# Patient Record
Sex: Female | Born: 1935 | Race: White | Hispanic: No | Smoking: Current every day smoker
Health system: Southern US, Community
[De-identification: ages and names within clinical notes are randomized; demographics above are authoritative.]

## PROBLEM LIST (undated history)

## (undated) DIAGNOSIS — R0600 Dyspnea, unspecified: Secondary | ICD-10-CM

## (undated) DIAGNOSIS — I2510 Atherosclerotic heart disease of native coronary artery without angina pectoris: Secondary | ICD-10-CM

## (undated) DIAGNOSIS — K219 Gastro-esophageal reflux disease without esophagitis: Secondary | ICD-10-CM

## (undated) DIAGNOSIS — G4730 Sleep apnea, unspecified: Secondary | ICD-10-CM

## (undated) DIAGNOSIS — M25559 Pain in unspecified hip: Secondary | ICD-10-CM

## (undated) DIAGNOSIS — T458X5A Adverse effect of other primarily systemic and hematological agents, initial encounter: Secondary | ICD-10-CM

## (undated) DIAGNOSIS — Z720 Tobacco use: Secondary | ICD-10-CM

## (undated) DIAGNOSIS — M87180 Osteonecrosis due to drugs, jaw: Secondary | ICD-10-CM

## (undated) DIAGNOSIS — M1990 Unspecified osteoarthritis, unspecified site: Secondary | ICD-10-CM

## (undated) DIAGNOSIS — I471 Supraventricular tachycardia: Secondary | ICD-10-CM

## (undated) DIAGNOSIS — N19 Unspecified kidney failure: Secondary | ICD-10-CM

## (undated) DIAGNOSIS — I10 Essential (primary) hypertension: Secondary | ICD-10-CM

## (undated) DIAGNOSIS — K649 Unspecified hemorrhoids: Secondary | ICD-10-CM

## (undated) DIAGNOSIS — E785 Hyperlipidemia, unspecified: Secondary | ICD-10-CM

## (undated) DIAGNOSIS — M109 Gout, unspecified: Secondary | ICD-10-CM

## (undated) DIAGNOSIS — N189 Chronic kidney disease, unspecified: Secondary | ICD-10-CM

## (undated) DIAGNOSIS — J449 Chronic obstructive pulmonary disease, unspecified: Secondary | ICD-10-CM

## (undated) DIAGNOSIS — J45909 Unspecified asthma, uncomplicated: Secondary | ICD-10-CM

## (undated) HISTORY — DX: Gastro-esophageal reflux disease without esophagitis: K21.9

## (undated) HISTORY — DX: Sleep apnea, unspecified: G47.30

## (undated) HISTORY — DX: Chronic kidney disease, unspecified: N18.9

## (undated) HISTORY — DX: Dyspnea, unspecified: R06.00

## (undated) HISTORY — DX: Pain in unspecified hip: M25.559

## (undated) HISTORY — DX: Adverse effect of other primarily systemic and hematological agents, initial encounter: M87.180

## (undated) HISTORY — DX: Essential (primary) hypertension: I10

## (undated) HISTORY — DX: Tobacco use: Z72.0

## (undated) HISTORY — DX: Atherosclerotic heart disease of native coronary artery without angina pectoris: I25.10

## (undated) HISTORY — DX: Supraventricular tachycardia: I47.1

## (undated) HISTORY — DX: Unspecified osteoarthritis, unspecified site: M19.90

## (undated) HISTORY — DX: Chronic obstructive pulmonary disease, unspecified: J44.9

## (undated) HISTORY — DX: Hyperlipidemia, unspecified: E78.5

## (undated) HISTORY — DX: Unspecified asthma, uncomplicated: J45.909

## (undated) HISTORY — DX: Gout, unspecified: M10.9

## (undated) HISTORY — PX: KNEE ARTHROSCOPY: SUR90

## (undated) HISTORY — PX: BREAST BIOPSY: SHX20

## (undated) HISTORY — DX: Unspecified hemorrhoids: K64.9

## (undated) HISTORY — DX: Unspecified kidney failure: N19

## (undated) HISTORY — PX: REPLACEMENT TOTAL KNEE: SUR1224

## (undated) HISTORY — PX: REPLACEMENT UNICONDYLAR JOINT KNEE: SUR1227

## (undated) HISTORY — DX: Adverse effect of other primarily systemic and hematological agents, initial encounter: T45.8X5A

---

## 1997-07-27 ENCOUNTER — Other Ambulatory Visit: Admission: RE | Admit: 1997-07-27 | Discharge: 1997-07-27 | Payer: Self-pay | Admitting: *Deleted

## 1997-07-27 ENCOUNTER — Ambulatory Visit (HOSPITAL_COMMUNITY): Admission: RE | Admit: 1997-07-27 | Discharge: 1997-07-27 | Payer: Self-pay | Admitting: *Deleted

## 1997-08-05 ENCOUNTER — Ambulatory Visit (HOSPITAL_COMMUNITY): Admission: RE | Admit: 1997-08-05 | Discharge: 1997-08-05 | Payer: Self-pay | Admitting: Specialist

## 1997-08-12 ENCOUNTER — Ambulatory Visit (HOSPITAL_COMMUNITY): Admission: RE | Admit: 1997-08-12 | Discharge: 1997-08-12 | Payer: Self-pay | Admitting: Specialist

## 1997-09-09 ENCOUNTER — Ambulatory Visit (HOSPITAL_COMMUNITY): Admission: RE | Admit: 1997-09-09 | Discharge: 1997-09-09 | Payer: Self-pay | Admitting: Specialist

## 1998-08-16 ENCOUNTER — Other Ambulatory Visit: Admission: RE | Admit: 1998-08-16 | Discharge: 1998-08-16 | Payer: Self-pay | Admitting: *Deleted

## 1998-08-18 ENCOUNTER — Ambulatory Visit (HOSPITAL_COMMUNITY): Admission: RE | Admit: 1998-08-18 | Discharge: 1998-08-18 | Payer: Self-pay | Admitting: *Deleted

## 1999-08-23 ENCOUNTER — Other Ambulatory Visit: Admission: RE | Admit: 1999-08-23 | Discharge: 1999-08-23 | Payer: Self-pay | Admitting: *Deleted

## 1999-08-23 ENCOUNTER — Ambulatory Visit (HOSPITAL_COMMUNITY): Admission: RE | Admit: 1999-08-23 | Discharge: 1999-08-23 | Payer: Self-pay | Admitting: *Deleted

## 2000-04-02 ENCOUNTER — Inpatient Hospital Stay (HOSPITAL_COMMUNITY): Admission: AD | Admit: 2000-04-02 | Discharge: 2000-04-05 | Payer: Self-pay | Admitting: Orthopedic Surgery

## 2000-04-02 ENCOUNTER — Encounter: Payer: Self-pay | Admitting: Orthopedic Surgery

## 2000-04-05 ENCOUNTER — Inpatient Hospital Stay (HOSPITAL_COMMUNITY)
Admission: RE | Admit: 2000-04-05 | Discharge: 2000-04-11 | Payer: Self-pay | Admitting: Physical Medicine & Rehabilitation

## 2000-08-29 ENCOUNTER — Ambulatory Visit (HOSPITAL_COMMUNITY): Admission: RE | Admit: 2000-08-29 | Discharge: 2000-08-29 | Payer: Self-pay | Admitting: Internal Medicine

## 2000-08-29 ENCOUNTER — Encounter: Payer: Self-pay | Admitting: Internal Medicine

## 2000-11-13 ENCOUNTER — Ambulatory Visit (HOSPITAL_COMMUNITY): Admission: RE | Admit: 2000-11-13 | Discharge: 2000-11-13 | Payer: Self-pay | Admitting: Gastroenterology

## 2001-08-28 ENCOUNTER — Other Ambulatory Visit: Admission: RE | Admit: 2001-08-28 | Discharge: 2001-08-28 | Payer: Self-pay | Admitting: Internal Medicine

## 2001-08-28 ENCOUNTER — Ambulatory Visit (HOSPITAL_COMMUNITY): Admission: RE | Admit: 2001-08-28 | Discharge: 2001-08-28 | Payer: Self-pay | Admitting: Internal Medicine

## 2001-08-28 ENCOUNTER — Encounter: Payer: Self-pay | Admitting: Internal Medicine

## 2001-12-17 ENCOUNTER — Ambulatory Visit (HOSPITAL_COMMUNITY): Admission: RE | Admit: 2001-12-17 | Discharge: 2001-12-17 | Payer: Self-pay | Admitting: Internal Medicine

## 2001-12-17 ENCOUNTER — Encounter: Payer: Self-pay | Admitting: Internal Medicine

## 2002-09-01 ENCOUNTER — Encounter: Payer: Self-pay | Admitting: Internal Medicine

## 2002-09-01 ENCOUNTER — Ambulatory Visit (HOSPITAL_COMMUNITY): Admission: RE | Admit: 2002-09-01 | Discharge: 2002-09-01 | Payer: Self-pay | Admitting: Internal Medicine

## 2003-09-01 ENCOUNTER — Other Ambulatory Visit: Admission: RE | Admit: 2003-09-01 | Discharge: 2003-09-01 | Payer: Self-pay | Admitting: Internal Medicine

## 2003-09-23 ENCOUNTER — Encounter (INDEPENDENT_AMBULATORY_CARE_PROVIDER_SITE_OTHER): Payer: Self-pay | Admitting: Specialist

## 2003-09-23 ENCOUNTER — Ambulatory Visit (HOSPITAL_COMMUNITY): Admission: RE | Admit: 2003-09-23 | Discharge: 2003-09-23 | Payer: Self-pay | Admitting: Obstetrics and Gynecology

## 2004-09-08 ENCOUNTER — Ambulatory Visit (HOSPITAL_COMMUNITY): Admission: RE | Admit: 2004-09-08 | Discharge: 2004-09-08 | Payer: Self-pay | Admitting: Internal Medicine

## 2005-09-12 ENCOUNTER — Ambulatory Visit (HOSPITAL_COMMUNITY): Admission: RE | Admit: 2005-09-12 | Discharge: 2005-09-12 | Payer: Self-pay | Admitting: Internal Medicine

## 2005-09-12 ENCOUNTER — Other Ambulatory Visit: Admission: RE | Admit: 2005-09-12 | Discharge: 2005-09-12 | Payer: Self-pay | Admitting: Internal Medicine

## 2005-09-21 ENCOUNTER — Ambulatory Visit (HOSPITAL_COMMUNITY): Admission: RE | Admit: 2005-09-21 | Discharge: 2005-09-21 | Payer: Self-pay | Admitting: Internal Medicine

## 2007-05-23 IMAGING — CR DG CHEST 2V
2 series · 2 of 2 positions shown · non-contrast
Comparison: 09/08/04.

CLINICAL DATA: Annual physical.  Smoker.  
 CHEST ? 2 VIEW:

[view not recorded (1 of 2)]
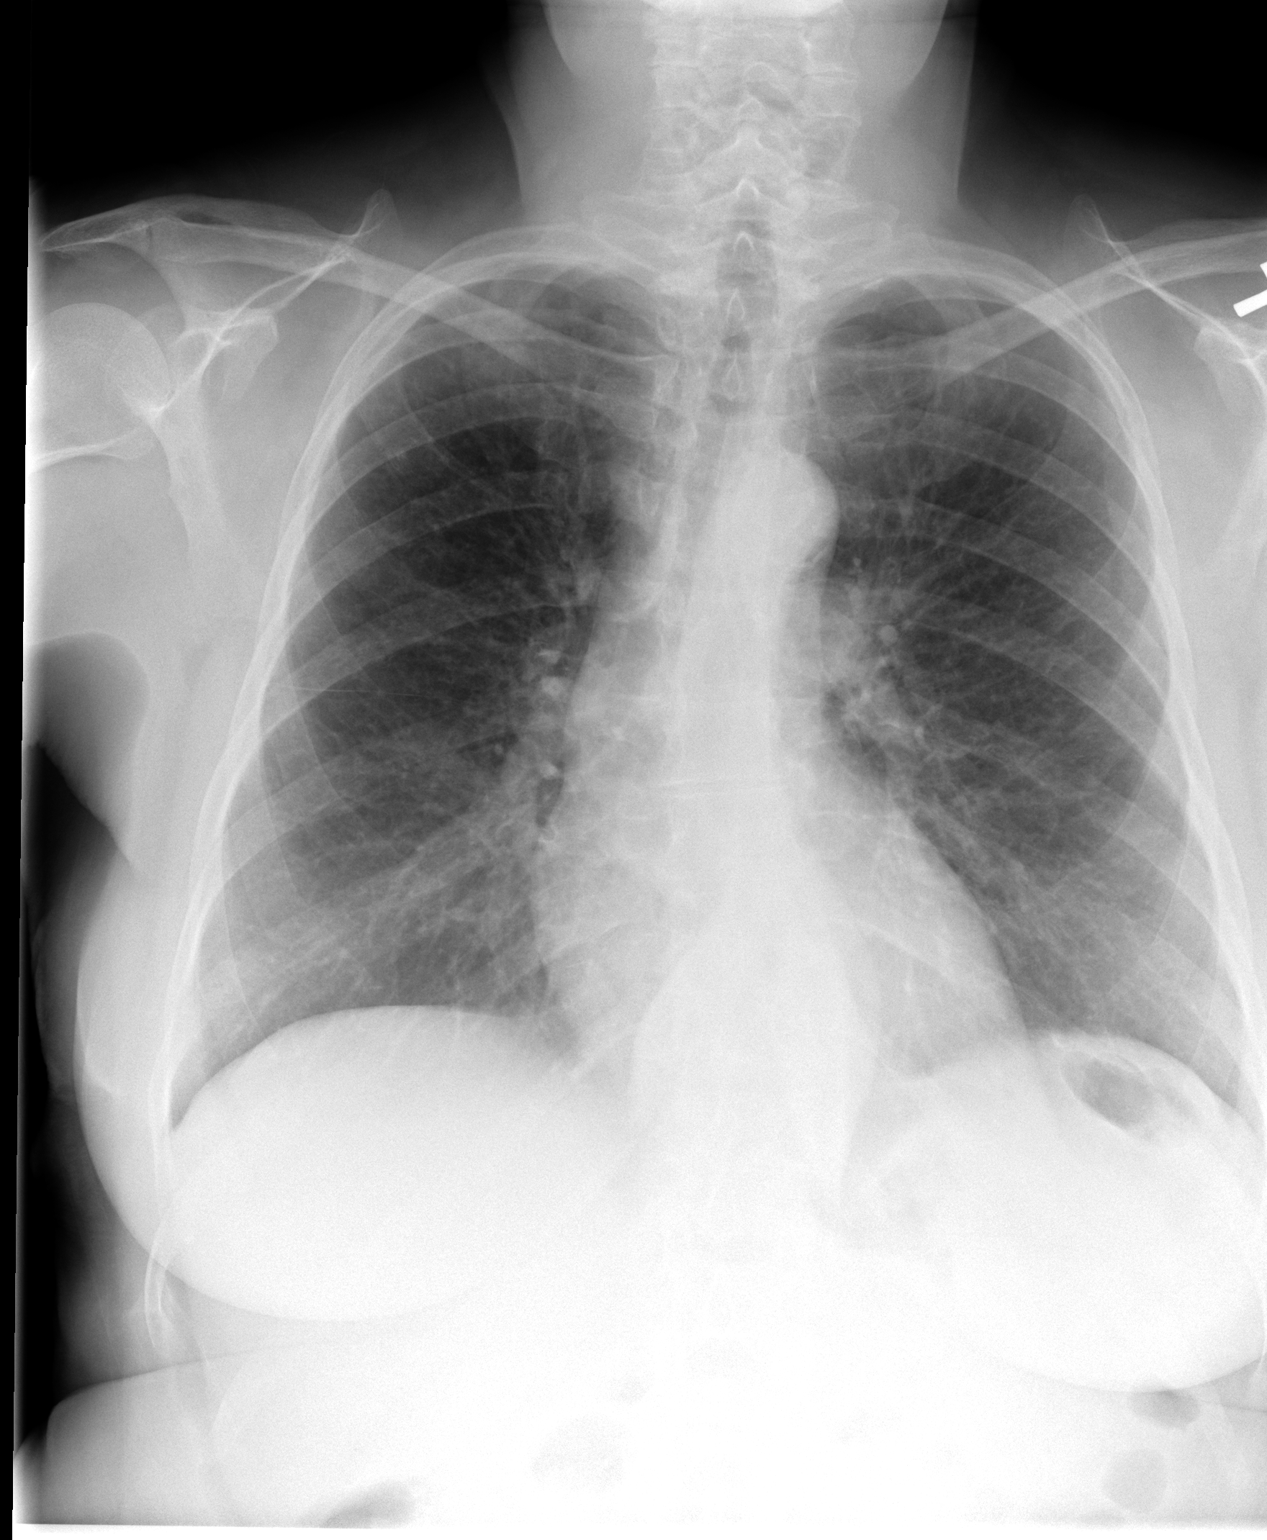

[view not recorded (2 of 2)]
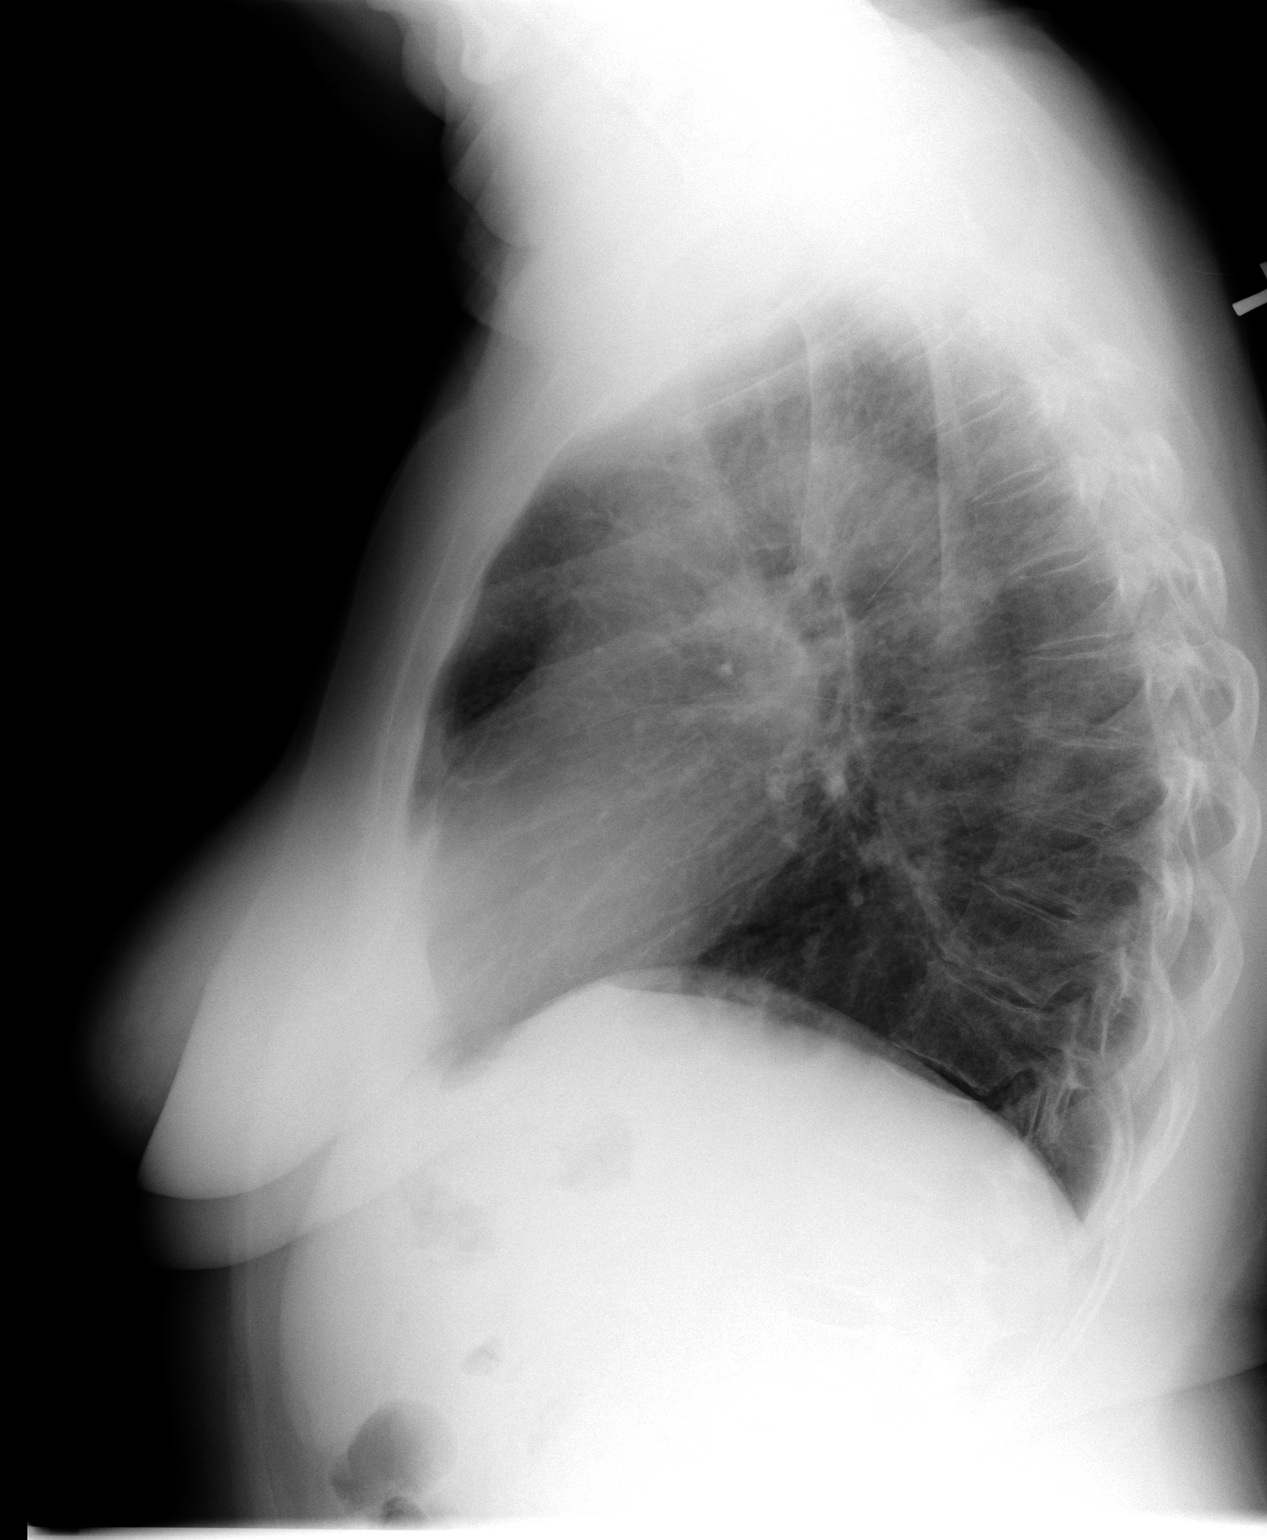

[2 of 2 positions shown; findings below may reference images not displayed]

FINDINGS: Heart size is normal.  There is an abnormal contour to the azygoesophageal recess on the right.  This is new since prior studies.  Interstitial markings remain mildly prominent without focal airspace opacity.  No effusion or pneumothorax.  S shaped scoliosis of the thoracolumbar spine is again noted.
IMPRESSION: 1. No acute finding. 
 2. Abnormal contour to the azygoesophageal recess on the right.  Mass versus lymphadenopathy could account for this versus superimposition of normal structures.  I would recommend contrast enhanced CT for further evaluation.

 This was discussed with [place], Dr. Iebe?[place] nurse by Dr. Kokkie on 09/12/05 at [DATE].

## 2007-06-01 IMAGING — CT CT CHEST W/ CM
4 series · 18 of 29 positions shown, 19 images · IV contrast ([ID] OMNIP 300%)
Comparison: [REDACTED] chest x-ray 09/12/05.

CLINICAL DATA: Right inferior mediastinal abnormality on [REDACTED] chest x-ray of 09/12/05 for further evaluation of possible mass or adenopathy.  
CHEST CT WITH CONTRAST:
TECHNIQUE: Multidetector CT imaging of the chest was performed following the standard protocol during bolus administration of intravenous contrast.
Contrast:  100 cc Omnipaque 300.

[Series 2: chest w/ · axial · 0.66mm/px · z∈[-240,-80]mm · 3 of 65 slices shown, 4 images]
[im 17/65  mediastinal]
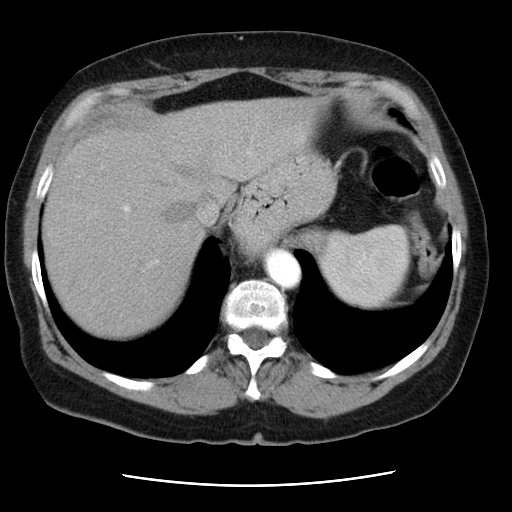
[im 17/65  lung]
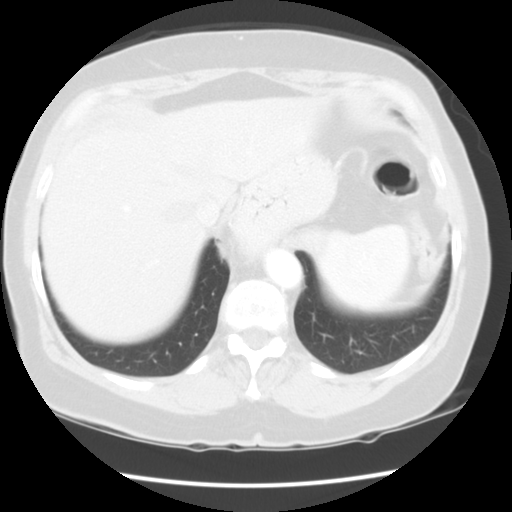
[im 33/65  lung]
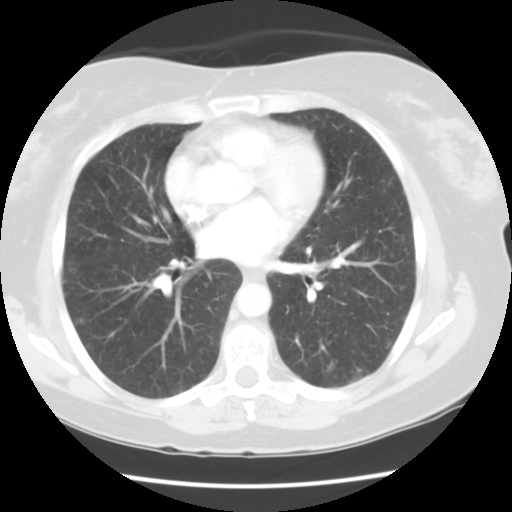
[im 49/65  lung]
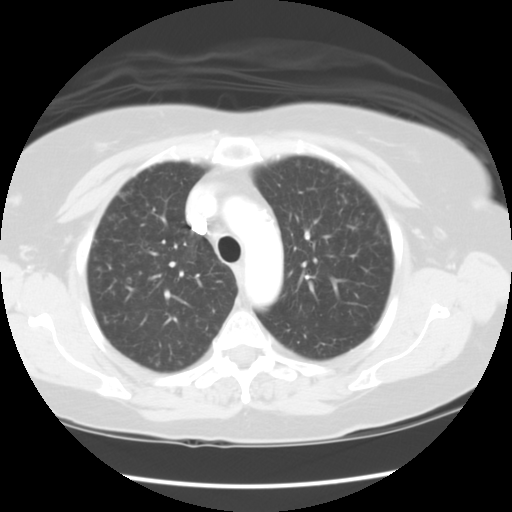

[Series 3: recon 2: chest w/ · axial · 0.66mm/px · z∈[-200,-100]mm · 3 of 60 slices shown]
[im 20/60  lung]
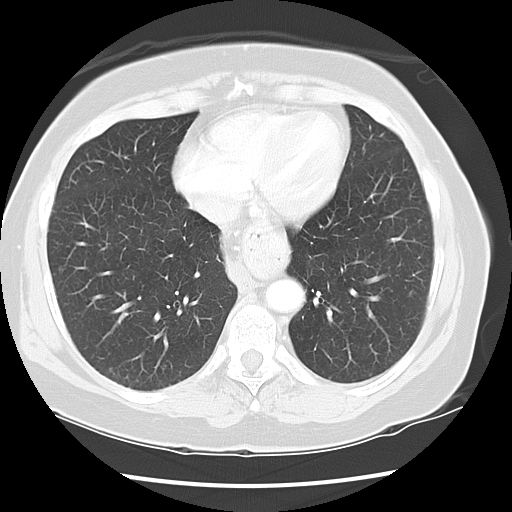
[im 28/60  lung]
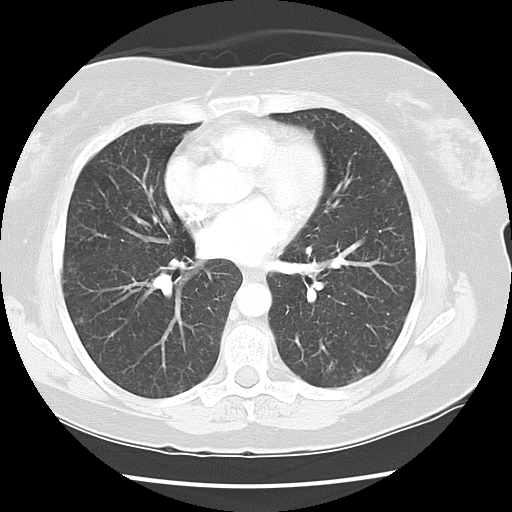
[im 40/60  lung]
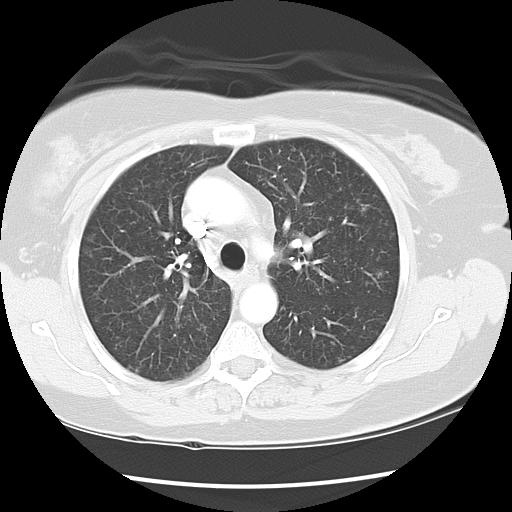

[Series 400: reformatted · sagittal · 0.66mm/px · 8 of 142 slices shown (1 of 2)]
[im 16/142  lung]
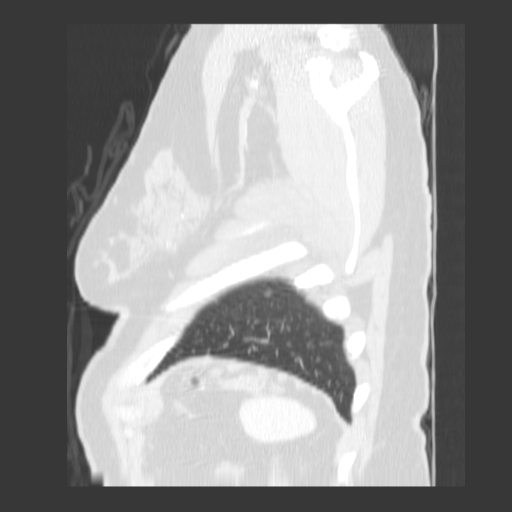
[im 32/142  lung]
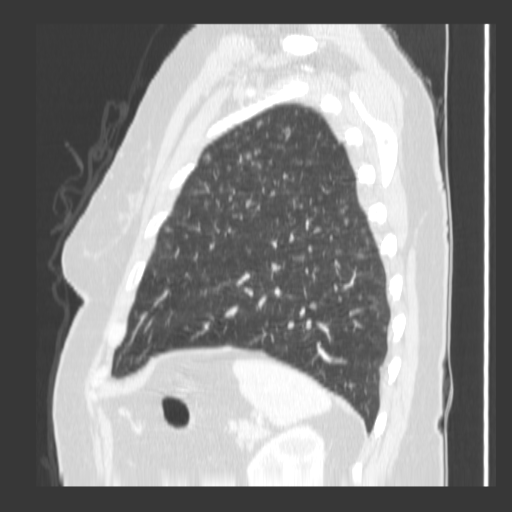
[im 48/142  lung]
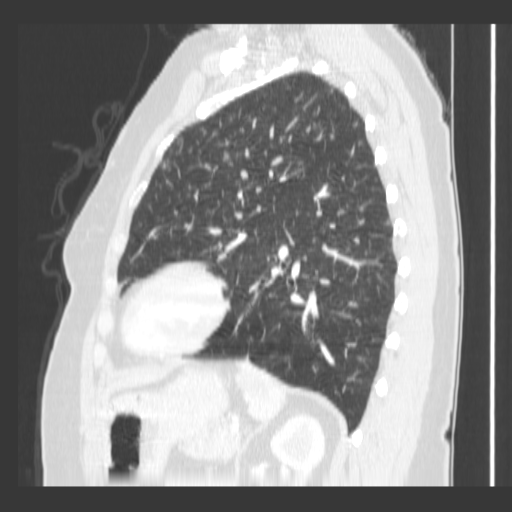
[im 63/142  lung]
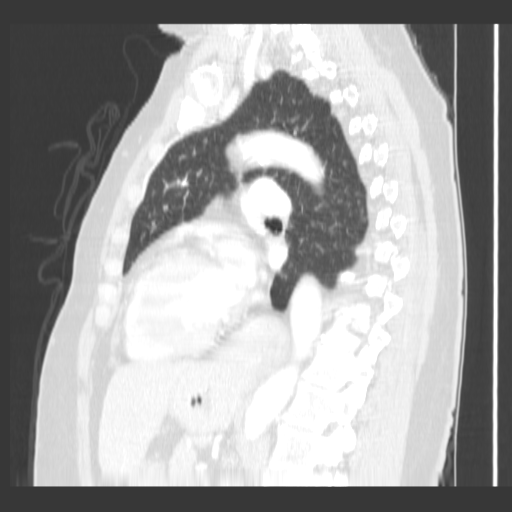
[im 79/142  lung]
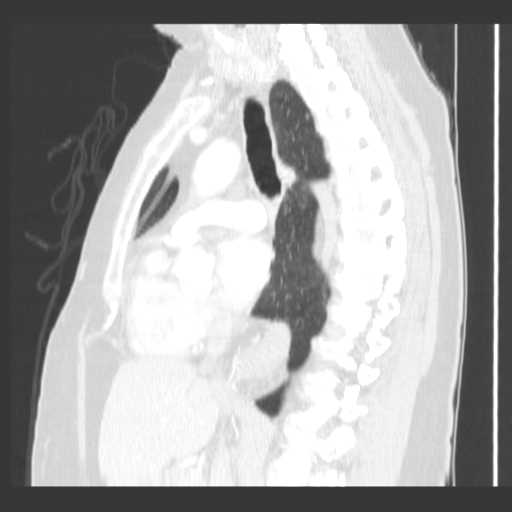
[im 95/142  lung]
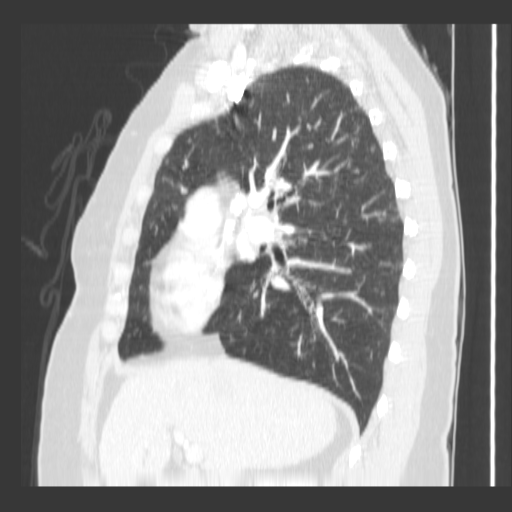
[im 110/142  lung]
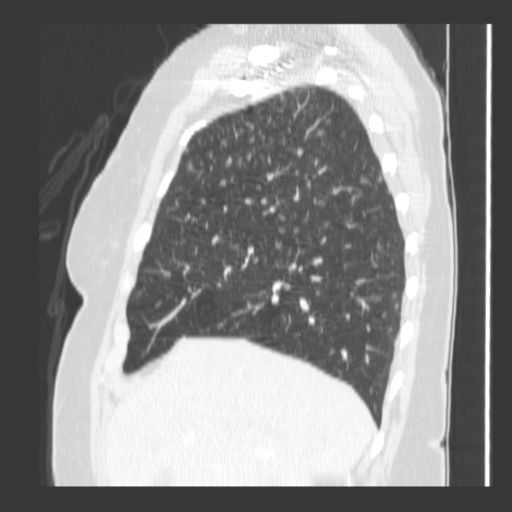
[im 126/142  lung]
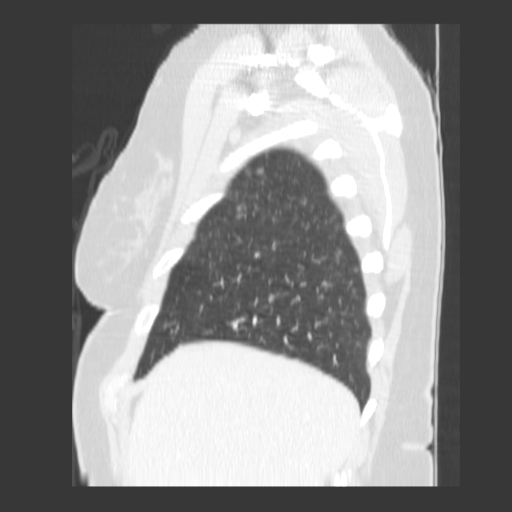

[Series 401: reformatted · coronal · 0.66mm/px · 4 of 122 slices shown (2 of 2)]
[im 16/122  lung]
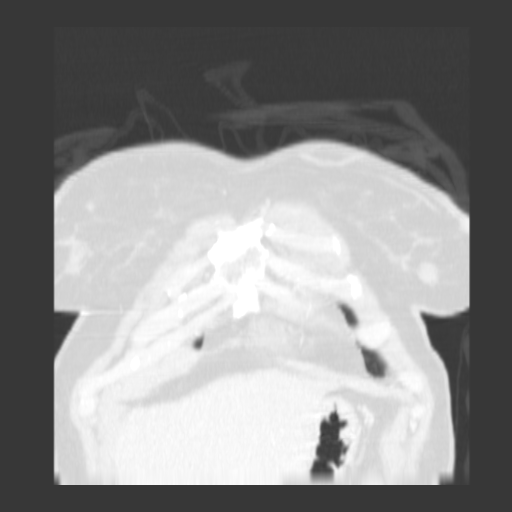
[im 31/122  lung]
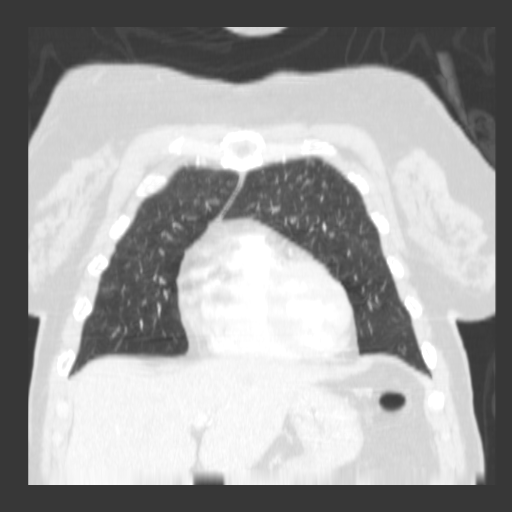
[im 46/122  lung]
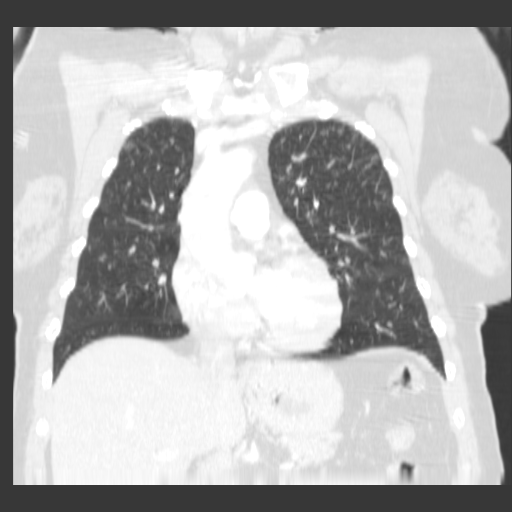
[im 61/122  lung]
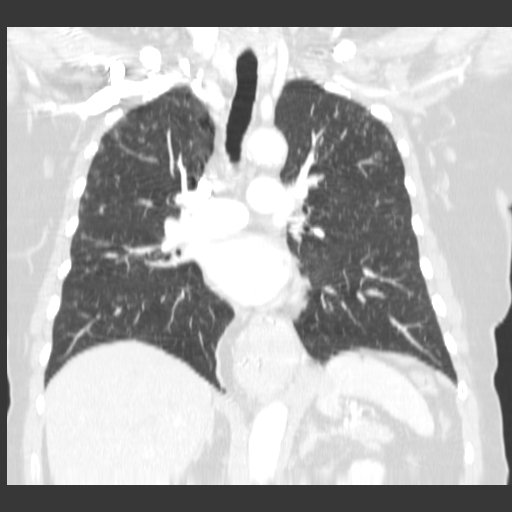

[18 of 29 positions shown; findings below may reference images not displayed]

FINDINGS: Chest x-ray opacity corresponds to moderate size hiatus hernia with small paraesophageal component.  Better seen on chest CT modality are tiny diffuse fibronodular changes throughout, yet most marked at the upper lobe distribution.  Remaining lungs appear clear.  No mediastinal, hilar, nor axillary mass/adenopathy is seen.  4 mm lateral segment left hepatic low-density favors incidental cyst with 9 mm upper pole left renal cyst.  Upper abdominal organs are otherwise normal appearing.  Heart size is normal.
IMPRESSION: 1.  Inferior mediastinal shadow on chest x-ray 09/12/05 corresponds to moderate size hiatus hernia with small component of paraesophageal type.  Diffuse tiny reticulonodular changes (less than 5 mm) primarily at the upper lobe distribution.   Differential diagnostic considerations include interstitial fibrosis, sarcoidosis, miliary tuberculosis, fungal disease, histiocytosis X, pneumoconiosis, and metastatic malignancy (such as lymphangitic carcinomatosis and thyroid cancer).  With the aid of retrospection and different modality, these findings are likely chronic since 09/01/02 and better demonstrated on chest CT modality.  
3.  Likely incidental small left hepatic cyst and left renal cyst. 
4.  Otherwise negative.

## 2010-02-13 HISTORY — PX: CARDIAC CATHETERIZATION: SHX172

## 2010-02-17 ENCOUNTER — Ambulatory Visit
Admission: RE | Admit: 2010-02-17 | Discharge: 2010-02-17 | Payer: Self-pay | Source: Home / Self Care | Attending: Cardiovascular Disease | Admitting: Cardiovascular Disease

## 2010-02-24 ENCOUNTER — Telehealth (INDEPENDENT_AMBULATORY_CARE_PROVIDER_SITE_OTHER): Payer: Self-pay | Admitting: *Deleted

## 2010-02-25 ENCOUNTER — Telehealth: Payer: Self-pay | Admitting: Cardiovascular Disease

## 2010-03-01 ENCOUNTER — Ambulatory Visit
Admission: RE | Admit: 2010-03-01 | Discharge: 2010-03-01 | Payer: Self-pay | Source: Home / Self Care | Attending: Cardiovascular Disease | Admitting: Cardiovascular Disease

## 2010-03-04 ENCOUNTER — Telehealth (INDEPENDENT_AMBULATORY_CARE_PROVIDER_SITE_OTHER): Payer: Self-pay | Admitting: *Deleted

## 2010-03-09 ENCOUNTER — Ambulatory Visit (HOSPITAL_COMMUNITY)
Admission: RE | Admit: 2010-03-09 | Discharge: 2010-03-09 | Payer: Self-pay | Source: Home / Self Care | Attending: Cardiovascular Disease | Admitting: Cardiovascular Disease

## 2010-03-14 ENCOUNTER — Ambulatory Visit
Admission: RE | Admit: 2010-03-14 | Discharge: 2010-03-14 | Payer: Self-pay | Source: Home / Self Care | Attending: Cardiovascular Disease | Admitting: Cardiovascular Disease

## 2010-03-17 NOTE — Progress Notes (Signed)
----   Converted from flag ---- ---- 03/04/2010 12:56 PM, Marilynne Halsted, CMA, AAMA wrote: [facility] verified as 2d insurance, no notification required.  ---- 03/04/2010 11:59 AM, Dessie Coma  LPN wrote: [facility] Sp[facility] Cath on 03/09/10 for chest pain, and abnormal stress test with Dr. Kirke Corin,  Thanks Victorino Dike ------------------------------

## 2010-03-17 NOTE — Progress Notes (Signed)
Summary: stress test  Phone Note Outgoing Call   Call placed by: Dessie Coma  LPN,  February 25, 2010 3:24 PM Call placed to: Patient Summary of Call: LMVM-for patient to call back-per Dr. Kirke Corin, stress test was abnormal.  To f/u next week.  Follow-up for Phone Call        Patient returned call-has f/u Tues. at 11:30am. Follow-up by: Dessie Coma  LPN,  February 25, 2010 3:39 PM

## 2010-03-17 NOTE — Progress Notes (Signed)
----   Converted from flag ---- ---- 02/24/2010 1:34 PM, Marilynne Halsted, CMA, AAMA wrote: Pt has Medicare and [employer].  [employer] verified as 2d per [facility].  No notification required.  ---- 02/24/2010 1:26 PM, Dessie Coma  LPN wrote: Eugenie Birks at [facility]. tomorrow for dyspnea.  Thanks,Aneisha Skyles ------------------------------

## 2010-03-28 NOTE — Assessment & Plan Note (Signed)
[facility]                       [place] CARDIOLOGY OFFICE NOTE  [name], [name]                       MRN:          [id number] DATE:03/14/2010                            DOB:          10-19-1935   [name] is a 75 year old female who is here today for a followup visit after recent cardiac catheterization.  The patient has the following problem list: 1. Nonobstructive coronary artery disease per cardiac catheterization     which was done in January 2012.  It showed 20% mid circumflex     stenosis and 30-40% diffuse mid RCA stenosis without any other     obstructive disease.  Ejection fraction was 65%. 2. Atrial tachycardia and frequent premature atrial contractions. 3. Hypertension. 4. Gastroesophageal reflux disease. 5. COPD. 6. Active tobacco use. 7. Sleep apnea. 8. Hyperlipidemia.  INTERVAL HISTORY:  [name] is here today after her recent cardiac catheterization, which overall showed no obstructive coronary artery disease.  There was mild atherosclerosis nonetheless.  Her stress test was falsely positive.  Overall, she has been doing reasonably well.  She has not had any chest pain.  She continues to have dyspnea.  Her palpitations have improved since she has been taking diltiazem.  She continues to have episodes of wheezing.  She is trying to quit smoking but is having hard time.  PHYSICAL EXAMINATION:  VITAL SIGNS:  Weight is 181.6 pounds, blood pressure is 147/82, pulse is 84, oxygen saturation is 97% on room air. NECK:  No JVD or carotid bruits. LUNGS:  Clear to auscultation. HEART:  Regular rate and rhythm with no gallops.  There is a 2/6 systolic ejection murmur at the aortic area. ABDOMEN:  Benign, nontender, nondistended. EXTREMITIES:  No clubbing, cyanosis, or edema.  IMPRESSION: 1. Significant exertional dyspnea is likely due to chronic obstructive     pulmonary disease and continued smoking.  Cardiac catheterization   showed no evidence of obstructive coronary artery disease.  She     continues to have occasional episodes of wheezing.  Due to that, I     will go ahead and stop the metoprolol and increase diltiazem to 240     mg once daily.  We will continue with aspirin 81 mg once daily.  I     also discussed with her the importance of significant lifestyle     changes including healthy diet, regular exercise, and smoking     cessation to prevent the progression of atherosclerosis that was     noted in her cardiac catheterization. 2. Atrial tachycardia and frequent premature atrial contractions.     These symptoms have improved significantly.  If there is recurrent     episodes, we will request 48-hour Holter monitor to ensure that     there is no underlying atrial fibrillation.  She has regular rhythm     by physical exam. 3. Hyperlipidemia:  She takes Crestor 5 mg daily as well as WelChol     625 mg 2 tablets twice daily.  She is going to have a lipid profile     checked in March.  If her LDL is  still above 100, then I recommend     increasing the dose of Crestor. 4. Tobacco use:  The patient was counseled regarding the importance of     smoking cessation.  She has used Chantix in the past and had     multiple side effects.  She we will try to do it on her own.    Lorine Bears, MD Electronically Signed   MA/MedQ  DD: 03/14/2010  DT: 03/15/2010  Job #: [id number]

## 2010-04-07 NOTE — Procedures (Signed)
[name], [name]                ACCOUNT NO.:  [id number]  MEDICAL RECORD NO.:  [id number]          PATIENT TYPE:  OIB  LOCATION:  2899                         FACILITY:  [facility]  PHYSICIAN:  Lorine Bears, MD     DATE OF BIRTH:  [birth year]  DATE OF PROCEDURE:  03/09/2010 DATE OF DISCHARGE:  03/09/2010                           CARDIAC CATHETERIZATION   PROCEDURES PERFORMED: 1. Left heart catheterization. 2. Coronary angiography. 3. Left ventricular angiography.  REFERRING PHYSICIAN:  Dr. Sedalia Muta in [place].  INDICATION AND CLINICAL HISTORY:  [name] is a 75 year old female with past medical history of atrial tachycardia, hypertension, COPD, and active tobacco use.  She presented with symptoms of dyspnea with minimal activities as well as palpitations.  She underwent a stress test, which showed evidence of anteroseptal ischemia.  Due to her continued symptoms in spite of medical therapy, cardiac catheterization was recommended. Risks, benefits, and alternatives were discussed with the patient.  STUDY DETAILS:  A standard informed consent was obtained.  She was given fentanyl and Versed for sedation.  The right radial area was prepped in a sterile fashion.  It was anesthetized with 1% lidocaine.  The radial artery was accessed with an anterior puncture easily.  A 5-French sheath was placed in the artery.  3 mg of verapamil was given twice.  The wire could not pass beyond the antecubital area.  Thus I performed angiography, which showed the presence of a radial loop.  After multiple attempts with a Wholey wire, the loop could not be passed.  Thus I aborted the radial approach.  The sheath was removed and a TR band was applied with no complications.  She was not given systemic anticoagulation.  Then the right femoral area was anesthetized with 1% lidocaine.  A 5-French sheath was placed in the right femoral artery. Coronary angiography was performed with a JL-4, JR-4, and  pigtail catheter.  All catheter exchanges were done over the wire.  Central aortic and left ventricular pressure were recorded with a standard pullback across the aortic valve.  Left ventricular angiography was performed in the RAO-30 position.  The patient tolerated the procedure well with no immediate complications.  PROCEDURAL FINDINGS:  Hemodynamic findings:  Left ventricular pressure was 146/11 with left ventricular end-diastolic pressure of 19 mmHg. Aortic pressure was 145/69 with a mean pressure 97 mmHg.  CORONARY ANGIOGRAPHY:  Left main coronary artery:  The vessel was normal in size with an upward takeoff.  It was free of any significant disease.  Left circumflex artery:  The vessel was normal in size with mild diffuse atherosclerosis.  There is a 20% tubular disease in the mid segment.  OM- 1 and OM-2 are very small branches.  OM-3 is a normal-sized vessel and free of significant disease.  Left anterior descending artery:  The vessel was normal in size with mild diffuse atherosclerosis without any significant obstructivedisease.  First and second diagonals are normal in size.  The third diagonal is very small in size.  All diagonal branches are free of significant disease.  Right coronary artery:  The vessel was normal in size and dominant.  It was mildly calcified.  There is a 30% to 40% diffuse disease in the mid segment without evidence of obstructive disease.  It gives a normal- sized right PDA as well as 3 medium-sized medium-sized posterolateral branches.  All are free of significant disease.  Left ventricular angiography:  This showed normal LV systolic function with an estimated ejection fraction of 65% with mild mitral regurgitation.  CONCLUSIONS: 1. Mild nonobstructive coronary artery disease. 2. Normal left ventricular systolic function. 3. Mild systemic hypertension and mildly elevated left ventricular     diastolic pressure.  RECOMMENDATIONS:  I  recommend medical therapy as well as smoking cessation.  If she continues to have palpitations in spite of treatment with metoprolol and Cardizem, we will likely obtain an outpatient event monitor or continuous telemetry to rule out other uncontrolled arrhythmia.     Lorine Bears, MD     MA/MEDQ  D:  03/09/2010  T:  03/10/2010  Job:  [id number]  cc:   Dr. Sedalia Muta  Electronically Signed by Lorine Bears MD on 04/07/2010 11:34:27 AM

## 2010-06-02 ENCOUNTER — Encounter: Payer: Self-pay | Admitting: Cardiovascular Disease

## 2010-06-06 ENCOUNTER — Encounter: Payer: Self-pay | Admitting: Cardiovascular Disease

## 2010-06-06 ENCOUNTER — Ambulatory Visit (INDEPENDENT_AMBULATORY_CARE_PROVIDER_SITE_OTHER): Payer: Medicare Other | Admitting: Cardiovascular Disease

## 2010-06-06 DIAGNOSIS — I10 Essential (primary) hypertension: Secondary | ICD-10-CM

## 2010-06-06 DIAGNOSIS — I2510 Atherosclerotic heart disease of native coronary artery without angina pectoris: Secondary | ICD-10-CM

## 2010-06-06 DIAGNOSIS — I471 Supraventricular tachycardia: Secondary | ICD-10-CM

## 2010-06-06 DIAGNOSIS — I498 Other specified cardiac arrhythmias: Secondary | ICD-10-CM

## 2010-06-06 DIAGNOSIS — F17200 Nicotine dependence, unspecified, uncomplicated: Secondary | ICD-10-CM

## 2010-06-06 DIAGNOSIS — E785 Hyperlipidemia, unspecified: Secondary | ICD-10-CM

## 2010-06-06 DIAGNOSIS — Z720 Tobacco use: Secondary | ICD-10-CM | POA: Insufficient documentation

## 2010-06-06 MED ORDER — OLMESARTAN MEDOXOMIL 40 MG PO TABS: 40.0000 mg | ORAL_TABLET | Freq: Every day | ORAL | Status: DC

## 2010-06-06 NOTE — Assessment & Plan Note (Signed)
Recommend a goal LDL <100. She is tolerating Crestor 10 mg daily which should be continued. She is off WelChol due to GI side effects.

## 2010-06-06 NOTE — Progress Notes (Signed)
HPI  [name] is here for a follow up visit. During her last visit, I stopped Metoprolol and increased dose of Diltiazem. She is also now taking Cymbalta. She is actually feeling much better than before. She denies any chest pain. Her dyspnea and palpitations improved.  She stopped taking WelChol due to GI side effects. She increased the dose of Crestor to 10 mg without any myalgia.   Allergies  Allergen Reactions  . Shellfish-Derived Products   . Sulfa Antibiotics   . Zetia (Ezetimibe)      Current Outpatient Prescriptions on File Prior to Visit  Medication Sig Dispense Refill  . albuterol (VENTOLIN HFA) 108 (90 BASE) MCG/ACT inhaler Inhale 2 puffs into the lungs every 6 (six) hours as needed.        Marland Kitchen aspirin 81 MG tablet Take 81 mg by mouth daily.        . beclomethasone (QVAR) 80 MCG/ACT inhaler Inhale 1 puff into the lungs 2 (two) times daily.        Marland Kitchen Bioflavonoid Products (ESTER C PO) Take by mouth daily.        . Calcium Carbonate-Vitamin D (CALCIUM + D) 600-200 MG-UNIT TABS Take by mouth daily.        . cholecalciferol (VITAMIN D) 1000 UNITS tablet Take 1,000 Units by mouth daily.        . cycloSPORINE (RESTASIS) 0.05 % ophthalmic emulsion 1 drop 2 (two) times daily.        . fluticasone (FLONASE) 50 MCG/ACT nasal spray 2 sprays by Nasal route daily.        . furosemide (LASIX) 20 MG tablet Take 20 mg by mouth daily.        . Multiple Vitamin (MULTIVITAMIN) tablet Take 1 tablet by mouth daily.        Marland Kitchen omeprazole-sodium bicarbonate (ZEGERID) 40-1100 MG per capsule Take 1 capsule by mouth 2 (two) times daily.        . rosuvastatin (CRESTOR) 5 MG tablet Take 10 mg by mouth daily.       Marland Kitchen DISCONTD: olmesartan (BENICAR) 20 MG tablet Take 20 mg by mouth daily.        . colesevelam (WELCHOL) 625 MG tablet Take 1,250 mg by mouth 2 (two) times daily with a meal.    stopped    . metoprolol (TOPROL-XL) 50 MG 24 hr tablet Take 50 mg by mouth daily.    stopped    . DISCONTD: diltiazem  (DILACOR XR) 120 MG 24 hr capsule Take 120 mg by mouth daily.        Marland Kitchen DISCONTD: sertraline (ZOLOFT) 100 MG tablet Take 100 mg by mouth daily.           Past Medical History  Diagnosis Date  . GERD (gastroesophageal reflux disease)   . COPD (chronic obstructive pulmonary disease)   . Sleep apnea   . Dyspnea   . CAD (coronary artery disease)     mild non obstructive per cardiac cath in 02/2010  . Atrial tachycardia   . HLD (hyperlipidemia)   . HTN (hypertension)   . Tobacco abuse      Past Surgical History  Procedure Date  . Cardiac catheterization 02/2010    LCX: 20% mid, RCA: 30-40% diffuse mid. EF :65%     No family history on file.   History   Social History  . Marital Status: Divorced    Spouse Name: N/A    Number of Children: N/A  . Years of  Education: N/A   Occupational History  . Not on file.   Social History Main Topics  . Smoking status: Current Everyday Smoker -- 1.0 packs/day  . Smokeless tobacco: Not on file  . Alcohol Use: No  . Drug Use: No  . Sexually Active: Not on file   Other Topics Concern  . Not on file   Social History Narrative  . No narrative on file     ROS Constitutional: Negative for fever, chills, diaphoresis, activity change, appetite change and fatigue.  HENT: Negative for hearing loss, nosebleeds, congestion, sore throat, facial swelling, drooling, trouble swallowing, neck pain, voice change, sinus pressure and tinnitus.  Eyes: Negative for photophobia, pain, discharge and visual disturbance.  Respiratory: Negative for apnea, cough, chest tightness, shortness of breath and wheezing.  Cardiovascular: Negative for chest pain, palpitations and leg swelling.  Gastrointestinal: Negative for nausea, vomiting, abdominal pain, diarrhea, constipation, blood in stool and abdominal distention.  Genitourinary: Negative for dysuria, urgency, frequency, hematuria and decreased urine volume.  Musculoskeletal: Negative for myalgias, back  pain, joint swelling, arthralgias and gait problem.  Skin: Negative for color change, pallor, rash and wound.  Neurological: Negative for dizziness, tremors, seizures, syncope, speech difficulty, weakness, light-headedness, numbness and headaches.  Psychiatric/Behavioral: Negative for suicidal ideas, hallucinations, behavioral problems and agitation. The patient is not nervous/anxious.     PHYSICAL EXAM   BP 152/92  Pulse 97  Ht 5\' 2"  (1.575 m)  Wt 171 lb (77.565 kg)  BMI 31.28 kg/m2  SpO2 95% Constitutional: She is oriented to person, place, and time. She appears well-developed and well-nourished. No distress.  HENT: No nasal discharge.  Head: Normocephalic and atraumatic.  Eyes: Pupils are equal, round, and reactive to light. Right eye exhibits no discharge. Left eye exhibits no discharge.  Neck: Normal range of motion. Neck supple. No JVD present. No thyromegaly present.  Cardiovascular: Normal rate, regular rhythm, normal heart sounds and intact distal pulses. Exam reveals no gallop and no friction rub.  There is a 2/6 SEM in aortic area with preserved S2.  Pulmonary/Chest: Effort normal and breath sounds normal. No stridor. No respiratory distress. She has no wheezes. She has no rales. She exhibits no tenderness.  Abdominal: Soft. Bowel sounds are normal. She exhibits no distension. There is no tenderness. There is no rebound and no guarding.  Musculoskeletal: Normal range of motion. She exhibits no edema and no tenderness.  Neurological: She is alert and oriented to person, place, and time. Coordination normal.  Skin: Skin is warm and dry. No rash noted. She is not diaphoretic. No erythema. No pallor.  Psychiatric: She has a normal mood and affect. Her behavior is normal. Judgment and thought content normal.      ASSESSMENT AND PLAN

## 2010-06-06 NOTE — Assessment & Plan Note (Signed)
Mild and nonobstructive. Continue medical therapy.

## 2010-06-06 NOTE — Patient Instructions (Signed)
Your physician recommends that you schedule a follow-up appointment in:  Your physician has recommended you make the following change in your medication: increase Benicar to 40mg 

## 2010-06-06 NOTE — Assessment & Plan Note (Signed)
Discussed again the importance of smoking cessation. She had side effects with Chantix before manifested by severe depression. Thus, I don't recommend using this again.

## 2010-06-06 NOTE — Assessment & Plan Note (Signed)
No more tachycardia. Her palpitations are much better since increasing the dose of Diltiazem.

## 2010-06-06 NOTE — Assessment & Plan Note (Signed)
BP has been elevated recently. Increase Benicar to 40 mg once daily.

## 2010-06-28 NOTE — Assessment & Plan Note (Signed)
[facility]                        [place] CARDIOLOGY OFFICE NOTE   ASHARA, LOU[place]BURY                       MRN:          [id number]  DATE:03/01/2010                            DOB:          Sep 30, 1935    Ms. Vanmaanen is a 75 year old female who is here today for a followup visit  after her recent stress test.  She has the following problem list:  1. Atrial tachycardia and frequent premature atrial contractions.  2. Hypertension.  3. Gastroesophageal reflux disease.  4. Chronic obstructive pulmonary disease.  5. Active tobacco use.  6. Sleep apnea.   INTERIM HISTORY:  I evaluated Ms. Ronk recently for symptoms of  palpitations as well as severe exertional dyspnea.  Her Holter monitor  showed evidence of PACs and atrial tachycardia.  She was already started  on Cardizem at that time by Dr. Sedalia Muta, her primary care physician.  The  Holter monitor also showed 1 episode of nonsustained ventricular  tachycardia.  The patient underwent dobutamine nuclear stress test which  showed evidence of mild anteroseptal ischemia with an ejection fraction  of 51%.  Since her last visit, the patient continues to have dyspnea  with minimal activities.  She rarely gets episodes of chest pain but she  does have what she describes as panic attacks manifested by  palpitations, generalized weakness with a vague chest discomfort.Marland Kitchen   MEDICATIONS:  1. Benicar 20 mg once daily.  2. Metoprolol 50 mg once daily.  3. Furosemide 20 mg once daily.  4. Crestor 5 mg once daily.  5. Pristiq 100 mg once daily.  6. Flonase twice daily.  7. QVAR 80 mcg twice daily.  8. WelChol 625 mg 2 tablets twice daily.  9. Albuterol inhaler as needed.  10.Aspirin 81 mg once daily.  11.Diltiazem extended release 120 mg once daily.   ALLERGIES:  SULFA and SHELLFISH.  The patient does not have allergy to  IV contrast.   PHYSICAL EXAMINATION:  GENERAL:  The patient appears to be at her stated  age  and in no acute distress.  She seems to be anxious though.  VITAL SIGNS:  Weight is 182.4 pounds, blood pressure is 177/86, pulse is  85, oxygen saturation is 96% on room air.  HEENT:  Normocephalic, atraumatic.  NECK:  No JVD or carotid bruits.  RESPIRATORY:  Normal respiratory effort with no use of accessory  muscles.  Auscultation reveals diminished breath sounds bilaterally with  no wheezing.  CARDIOVASCULAR:  Normal PMI.  Normal S1 and S2 with no gallops.  There  is a 2/6 systolic ejection murmur at the aortic area.  ABDOMEN:  Benign,  nontender and nondistended.  EXTREMITIES:  With no clubbing, cyanosis or edema.  SKIN:  Warm and dry with no rash.  PSYCHIATRIC:  She is alert, oriented x3 with normal mood and affect.   IMPRESSION:  1. Vague atypical chest pain associated with dyspnea with minimal      activities.  She underwent a dobutamine nuclear stress test which      showed evidence of mild anteroseptal ischemia with borderline  reduced left ventricular systolic function.  Ejection fraction was      51%.  Based on her continued symptoms as well as abnormal stress      test and nonsustained ventricular tachycardia on her Holter      monitor, I recommend proceeding with cardiac catheterization and      possible coronary intervention.  Risks and benefits were discussed      with the patient.  In the meantime, we will continue with daily      aspirin, metoprolol, diltiazem and Crestor.  She has no      contraindication for a drug-eluting stent if needed.  2. Atrial tachycardia and frequent premature atrial contractions:  Her      symptoms improved with diltiazem which will be continued.  3. Hypertension:  Her blood pressure is elevated but appears to be due      to anxiety.  We will consider adding hydrochlorothiazide to Benicar      if needed.  4. Systolic ejection murmur in the aortic area.  Her echocardiogram      from last year showed no significant aortic stenosis.   There was      mild mitral regurgitation.  The patient will follow up in 1 week      after cardiac catheterization.     Lorine Bears, MD  Electronically Signed    MA/MedQ  DD: 03/01/2010  DT: 03/02/2010  Job #: [id number]

## 2010-06-28 NOTE — Letter (Signed)
February 17, 2010    Blane Ohara, MD  [address]  [place], [place]  [address]   RE:  Victoria, Hughes  MRN:  [id number]  /  DOB:  1935-12-12   Dear Dr. Sedalia Muta:   Thank you for referring [name] for further cardiac evaluation  regarding her arrhythmia.  As you are aware, this is a pleasant 75-year-  old female with the following problem list:  1. Reported history of supraventricular tachycardia.  2. Hypertension.  3. Mitral valve prolapse.  4. Gastroesophageal reflux disease.  5. COPD.  6. Sleep apnea.  7. Tobacco use.   CLINICAL HISTORY:  [name] has been having issues with palpitations  associated with dizziness over the last year.  She has been having  significant symptoms of depression and anxiety over the last 2 years  since the sudden death of her adopted son at the age of 73 which was  thought to be due to undiagnosed cardiomyopathy.  She describes frequent  episodes of palpitations associated with anxiety to the point where she  feels that she is having panic attacks.  These are associated with  sweating, dizziness and presyncope.  She has no syncopal episodes  associated with this.  She also reports dyspnea with minimal activities  which she always attributed to her lung disease.  She does have a long  history of tobacco use.  She does not have to use her albuterol inhaler  though on a regular basis.  She is aware of a heart murmur.   MEDICATIONS:  1. Benicar 20 mg once daily.  2. Metoprolol 50 mg once daily.  3. Furosemide 20 mg once daily.  4. Crestor 5 mg once daily.  5. Pristiq 100 mg once daily.  6. Flonase twice daily.  7. QVAR 80 mcg twice daily which replaced Advair.  8. WelChol 625 mg 2 tablets twice daily.  9. Albuterol inhaler as needed.  10.Aspirin 81 mg once daily.  11.Diltiazem extended-release 120 mg once daily which was started      recently.   ALLERGIES:  Include SULFA, ZETIA AND SHELLFISH.  STATINS caused  myalgias.   SOCIAL HISTORY:  Is  remarkable for smoking 1 pack per day for many  years.  She denies any alcohol or recreational drug use.  She lives by  herself.   FAMILY HISTORY:  Is negative for premature coronary artery disease.   REVIEW OF SYSTEMS:  Is remarkable for palpitations, dizziness, anxiety  and dyspnea as described above.  A full review of systems was performed  and is otherwise negative.   PHYSICAL EXAMINATION:  GENERAL APPEARANCE:  The patient appears to be at  her stated age and in no acute distress.  VITAL SIGNS:  Blood pressure is 158/78, heart rate is 60 beats per  minute.  HEENT:  Normocephalic, atraumatic.  NECK:  No JVD or carotid bruits.  RESPIRATORY:  Normal respiratory effort with no use of accessory  muscles.  Auscultation reveals normal breath sounds with no wheezing.  CARDIOVASCULAR:  Normal PMI.  Normal S1, S2 with no gallops.  There is a  73/6 systolic ejection murmur at the aortic area with preserved S2 and  normal carotid upstroke.  ABDOMEN:  Abdomen is benign, nontender, nondistended.  EXTREMITIES:  With no clubbing, cyanosis or edema.  SKIN: Warm and dry with no rash.  PSYCHIATRIC:  She is alert and oriented x3 with normal mood and affect.   An electrocardiogram was performed which showed sinus rhythm with  occasional PACs.   IMPRESSION:  1. Palpitations likely due to premature atrial contractions and atrial      tachycardia.  She did have a Holter monitor done in November 2011.      The report is slightly confusing, given that it says that the      baseline rhythm was normal sinus rhythm and atrial fibrillation.      However, in the arrhythmia analysis it seems that she had sinus      rhythm with occasional PACs with short runs of atrial tachycardia      which were at a regular rate.  She also had a 3-beat run of      ventricular tachycardia at a rate of 150.  I think there are two      important issues in this situation.  Obviously, down the road we      will have to make  sure that she does not have atrial fibrillation      which will require long-term anticoagulation.  Currently she is in      normal sinus rhythm.  Most of her symptoms seems to be related to      PACs and atrial tachycardia.  I agree with starting diltiazem which      seems to have improved her symptoms.  Her current heart rate is 60      beats per minute.  The other issue is the presence of nonsustained      ventricular tachycardia.  I think it is important in this situation      to rule out underlying coronary artery disease, and thus I      recommend proceeding with a pharmacological nuclear stress test, as      she is not able to exercise on a treadmill.  She does have history      of chronic obstructive pulmonary disease but she is not wheezing      and does not use albuterol on a regular basis.  Thus I think it      would be safe to give her a Lexiscan.  I will consider repeating      the Holter monitor while she is on diltiazem in the future based on      her symptoms.  2. Dyspnea with minimal activities, could be due to her chronic      obstructive pulmonary disease but will have to rule out underlying      coronary artery disease as mentioned above, and this will be      evaluated with a stress test.  3. Systolic ejection murmur in the aortic area, likely due to aortic      sclerosis.  She mentions that she had an echocardiogram done last      year at Dr. Terrilee Croak office.  I will request a copy of that report.   The patient will follow up with me after cardiac testing.  Thank you for  allowing me to participate in the care of your patient.    Sincerely,      Victoria Bears, MD  Electronically Signed    MA/MedQ  DD: 02/17/2010  DT: 02/17/2010  Job #: [id number]

## 2010-07-01 NOTE — Op Note (Signed)
[place]. [facility]  Patient:    [name], [name]                       MRN: [id number] Proc. Date: 04/02/00 Adm. Date:  [id number] Attending:  Faith Rogue T                           Operative Report  PREOPERATIVE DIAGNOSIS:  Left knee degenerative joint disease.  POSTOPERATIVE DIAGNOSIS:  Left knee degenerative joint disease.  OPERATION:  Left total knee replacement using [employer] Scorpio total knee system with a #7 cemented femoral component, #7 cemented tibial component with a 12 mm polyethylene tibial spacer and #5 cemented 8 mm polyethylene patella button.  SURGEON:  Elana Alm. Thurston Hole, M.D.  ASSISTANT:  Georgena Spurling, M.D. and Julien Girt, P.A.  ANESTHESIA:  General.  OPERATIVE TIME:  Two hours and 20 minutes.  ESTIMATED BLOOD LOSS:  100 cc.  COMPLICATIONS:  None.  DESCRIPTION OF PROCEDURE:  [name] was brought to the operating room on April 02, 2000, and placed on the operating room table in the supine position.  After an adequate level of general anesthesia was obtained, her left knee was examined under anesthesia.  She had a range of motion from 0-125 degrees, 1+ crepitation, mild varus deformity, and the knee was stable to ligamentous examination with normal patella tracking.  She had a Foley catheter placed under sterile conditions, and received Ancef 1 g IV preoperatively for prophylaxis.  The left leg was then prepped using sterile Betadine and draped using a sterile technique.  After this was done, then the leg was exsanguinated and a thigh tourniquet elevated to 350 mmHg.  Initially through a 20.0 cm incision based over the patella, initial exposure was made. The underlying subcutaneous tissues were incised in line with the skin incision.  A median arthrotomy was performed revealing an excessive amount of normal-appearing joint fluid.  She was found to have grade 4 changes over her medial compartment, grade 3 changes  laterally, and in the patellofemoral joint.  Osteophytes were removed from the femoral condyles, and medial and lateral meniscal remnants were removed, as well as the anterior cruciate ligament.  An intramedullary drill was then drilled up the femoral canal for the placement of the distal femoral cutting jig, which was placed in the appropriate amount of rotation, and a distal 10.0 mm cut was made.  The distal femur was incised.  A #7 was found to be the appropriate size, and the #7 cutting jig was placed, and then these cuts were made.  The proximal tibia was exposed.  The tibial spines were removed with an oscillating saw.  An intramedullary drill drilled down the tibial canal for the placement of the proximal tibial cutting jig, and with the jig placed on the medial side, a 6.0 mm cut was placed and made on the proximal tibia.  After this was done, then the Scorpio cutter for the PCL sacrificing cuts were made back on the femur, and these cuts were made.  After this was done then the #7 femoral trial was placed and found to be an excellent fit.  The #7 tibial spacer with a 12.0 mm polyethylene spacer was then placed, and this was found to be an excellent fit.  Range of motion 0-125 degrees with excellent stability, and no lift-off on the tray.  The tibial tray was then marked for rotation, and then  the keel cut was made.  After this was done, the patella was sized.  It was felt that a #5 size would be the appropriate size, and an 8.0 to 9.0 mm cut was made with a resurfacing cut on the patella.  Three locking holes were placed for the #5 resurfaced patella.  After this was done, it was felt that all the trial components were of excellent size, fit, and stability.  At this point the knee was jet-lavaged, irrigated with 3 L of saline solution.  The proximal tibia was then exposed, and the tibial base plate #7 with cement backing was hammered into position, with an excellent fit.  The #7  femoral component with cement backing was hammered into position; however, as the femoral component was going in, the cement got hard very fast, and with the cement getting harder, I could not totally see the femoral component, thus I chose to remove it.  On removing the femoral component, a portion of the lateral femoral condyle cancellus bone came off with the cement; however, the lateral femoral cortex of the lateral femoral condyle remained intact.  Because of this, I elected to very carefully tease and remove the lateral femoral cortex cancellus bone that was still attached to the prosthesis using a curved and straight osteotomes, and a very thin saw.  I removed the lateral femoral condylar bone in one piece, and placed it back into position, and it actually fit in an anatomic position against the lateral femoral condyle where it had come off.  Dr. Sherlean Foot, who was assisting me, agreed that this would be satisfactory, considering again that the lateral femoral condyle cortex from the anterior, lateral, and posterior was still completely intact, and thus the mechanical integrity of the distal femur was intact.  All of the hardened cement was removed from the #7 femoral prosthesis.  At this point a new batch of cement was made, and holding the lateral femoral bone in place, new cement was placed on it, and the #7 prosthesis was hammered into position with an excellent fit, with no displacement whatsoever of the lateral femoral condylar piece.  Excess cement was removed from around the edges.  The 12.0 mm polyethylene spacer was placed on the tibial base plate, and locked into position, and with the knee taken through a range of motion there was found to be no lift-off on the tray, excellent stability of the lateral femoral condyle piece which was now cemented in place under the prosthesis.  The patella #5 button was then placed onto its cut surface of the patella and locked into position,  and excess cement being removed from around the edges.  After the cement hardened, the patella femoral tracking was evaluated, and this was found to be normal, with no need for a lateral release.  Again I checked the  integrity of the lateral femoral condyle piece, and it was found to be very stable, and no displacement whatsoever.  The wound was further irrigated, and then a Hemovac drain placed in the joint, and the arthrotomy closed with #1 Panacryl suture.  The subcutaneous tissues were closed with #0 and #2-0 Vicryl.  The skin was closed with skin staples.  Sterile dressings were applied.  The Hemovac was injected with 0.25% Marcaine with epinephrine, and then clamped.  The patient then had an epidural catheter placed by anesthesia for postoperative pain.  She was then awakened and taken to the recovery room in stable condition.  The needle and sponge  counts were correct x 2 at the end of the case.  FOLLOW-UP CARE:  Ms. Boonstra underwent x-rays in the recovery room of her knee that showed that the prosthesis and the lateral femoral condyle bone was on an anatomic position, and that the prosthesis was in an excellent position. DD:  04/06/00 TD:  04/07/00 Job: [id number]

## 2010-07-01 NOTE — Discharge Summary (Signed)
[place]. [facility]  Patient:    [name], [name]                       MRN: [id number] Adm. Date:  [id number] Disc. Date: [id number] Attending:  Faith Rogue T Dictator:   Kirstin A. Shepperson, P.A.                           Discharge Summary  ADMISSION DIAGNOSES: 1. End-stage degenerative joint disease left knee. 2. Coronary artery disease. 3. Hypertension.  DISCHARGE DIAGNOSES: 1. End-stage degenerative joint disease left knee, status post left total knee    replacement. 2. Postoperative blood loss anemia. 3. Esophageal reflux. 4. Hypertension. 5. Bronchitis. 6. Coronary artery disease.  HISTORY OF PRESENT ILLNESS:  The patient is a 75 year old female with a history of bilateral knee pain, left greater than right, for years.  She has tried anti-inflammatories, intra-articular cortisone injections, and Synvisc injections, all without success.  At this point in time she has pain at night, pain at rest, pain with activity.  She understands the risks, benefits, and possible complications of a total knee replacement and is without question.  PROCEDURES:  On April 02, 2000, the patient underwent a left total knee replacement by Dr. Thurston Hole.  HOSPITAL COURSE:  She tolerated the procedure well and was admitted postoperatively.  On postoperative day #1 surgical wound is well approximated. Blood pressure is 92/45.  Hemoglobin is 9.8.  INR is 1.2.  She is metabolically stable with a BMET.  Hemovac was removed on postoperative day #1.  On postoperative day #2 the patient is well controlled on p.o. pain medicines.  T-max 99.1, O2 saturations 93% on 2 L.  Hemoglobin is 8.7.  IV fluids were discontinued, and rehabilitation consult was called.  On postoperative day #3 surgical wound was well approximated.  The patient was slightly constipated.  Hemoglobin was 8.3.  She was still moderate assist with physical therapy.  She was transferred to rehabilitation in  stable condition. We will continue to follow her on rehabilitation.  DISCHARGE MEDICATIONS:  1. Percocet 1-2 q.4-6h. p.r.n. pain.  2. Colace 100 mg 1 p.o. b.i.d.  3. Coumadin per pharmacy protocol.  4. Hydrochlorothiazide.  5. Avapro.  6. Paxil.  7. Toprol.  8. Nexium.  9. Lipitor. 10. Premarin. 11. Provera. 12. Flonase. 13. Advair. 14. Wellbutrin. DD:  05/02/00 TD:  05/03/00 Job: [id number]

## 2010-07-01 NOTE — Op Note (Signed)
[place]. [facility]  Patient:    [name], [name] Visit Number: [id number] MRN: [id number]          Service Type: END Location: ENDO Attending Physician:  Orland Mustard Dictated by:   Llana Aliment. Randa Evens, M.D. Proc. Date: 11/13/00 Admit Date:  11/13/2000   CC:         Marinus Maw, M.D.   Operative Report  PROCEDURE PERFORMED:  Esophagogastroduodenscopy.  ENDOSCOPIST:  Llana Aliment. Randa Evens, M.D.  MEDICATIONS:  Cetacaine spray.  INDICATIONS:  Persistent esophageal reflux.  DESCRIPTION OF PROCEDURE:  The procedure had been explained to the patient and consent obtained.  With the patient in the left lateral decubitus position, the Olympus video endoscope was inserted blindly in the esophagus and advanced under direct visualization.  The stomach was identified and passed.  The duodenum including the bulb and second portion were seen well and were unremarkable.  Scope withdrawn.  The pyloric channel, antrum and body were seen well and were normal.  There was a hiatal hernia that ranged from 38 to 32 cm just 6 cm in length.  The distal and proximal esophagus were normal. The scope was withdrawn.  The patient tolerated the procedure well.  ASSESSMENT:  Large hiatal hernia probable esophageal reflux disease.  PLAN:  Will proceed with colonoscopy at this time.  Will continue on Nexium. Dictated by:   Llana Aliment. Randa Evens, M.D. Attending Physician:  Orland Mustard DD:  11/13/00 TD:  11/13/00 Job: [id number]

## 2010-07-01 NOTE — Procedures (Signed)
[place]. [facility]  Patient:    REECE, MCBROOM Visit Number: [id number] MRN: [id number]          Service Type: END Location: ENDO Attending Physician:  Orland Mustard Dictated by:   Llana Aliment. Randa Evens, M.D. Proc. Date: 11/13/00 Admit Date:  11/13/2000   CC:         Marinus Maw, M.D.   Procedure Report  PROCEDURE:  Colonoscopy.  MEDICATIONS:  The patient received a total of 125 mcg of Fentanyl and 10 of Versed for both procedures.  SCOPE:  Adult Olympus Video Colonoscope.  INDICATIONS:  Rectal bleeding.  DESCRIPTION OF PROCEDURE:  The procedure had been explained to the patient and consent obtained.  With the patient in the left lateral decubitus, the Olympus adult video colonoscope was inserted following digital exam and advanced under direct visualization.  The prep was excellent.  I was able to advance easily to the cecum using abdominal pressure and position changes.  The ileocecal valve and appendiceal orifice identified.  The scope was withdrawn.  The cecum, ascending colon, hepatic flexure, transverse colon, splenic flexure, and descending colon and sigmoid colon were seen well upon removal.  No polyps or other lesions seen.  No significant diverticular disease.  The scope was withdrawn and the patient tolerated the procedure well and was maintained on pulse oximeter and low-flow oxygen for both procedures.  ASSESSMENT: 1. Internal hemorrhoids, probably the source of her rectal bleeding.  PLAN:  We will see back in the office in 3-4 months. Dictated by:   Llana Aliment. Randa Evens, M.D. Attending Physician:  Orland Mustard DD:  11/13/00 TD:  11/13/00 Job: [id number]

## 2010-07-01 NOTE — Discharge Summary (Signed)
[place]. [facility]  Patient:    [name], [name]                         MRN: [id number] Adm. Date:  04/05/00 Disc. Date: 04/11/00 Attending:  Faith Rogue, M.D. Dictator:   Dian Situ, PA CC:         Elana Alm. Thurston Hole, M.D.  Marinus Maw, M.D.   Discharge Summary  DISCHARGE DIAGNOSES: 1. Status post left total knee replacement. 2. Postoperative anemia. 3. Hypertension. 4. Enterococcus urinary tract infection.  HISTORY OF PRESENT ILLNESS:  Ms. [name] is a 75 year old female with history of hypertension, bilateral knee pain, left greater than right, who failed conservative therapy and elected to undergo left total knee replacement on February 18 by Dr. Thurston Hole.  Postop is 20+ and weightbearing on left lower extremity and on Coumadin for DVT prophylaxis.  She is on progressive therapy with minimal to moderate assistance for transfers, minimal assistance ambulating 30 to 60 feet with standard walker, knee flexion 55 degrees.  PAST MEDICAL HISTORY:  Significant for hypertension, history of hypercholesterolemia, GERD, bronchitis OA, hemorrhoids, cataracts.  ALLERGIES:  ______  SOCIAL HISTORY:  The patient lives in one-level home with five steps at entry, was independent prior to admission.  She smokes one pack per day, does not use any alcohol.    Used and cane and crutches prior to admission.  HOSPITAL COURSE:  Ms. [name] was admitted to rehabilitation on April 05, 2000, for inpatient therapy to consist of PT and OT daily.  Past admission, the patient was maintained on Coumadin for DVT prophylaxis.  Left knee incision was noted to be healing well without any signs or symptoms of infection.  No drainage was noted.  UA and C&S were sent off showing greater than 100,000 colonies of Enterococcus, and she was started on amoxicillin. There was a question of hyperglycemia, and CBGs were checked and were essentially within normal limits.   CBC checked at admission showed hemoglobin 8.4, hematocrit 25.0.  Recheck on February 25 showed improvement at 9.0 and 26.6.  Check of electrolytes showed sodium 138, potassium 3.8, chloride 99, CO2 30, BUN 9, creatinine 0.9, glucose 108.  LFTs were within normal limits except for low protein stores at total protein 5.9, albumin 2.3.  The patient was maintained on iron supplements throughout her stay and to continue on this past discharge.  During her stay on rehabilitation, she progressed to being modified independent level for transfers, modified independent ambulating 150 x 2 with standard walker.  Reflection was at 72 degrees, passive range of motion while sitting.  In terms of ADLs, the patient as modified independent for self care needs and toileting.  Further followup therapies to include home health PT, OT which will be provided by [facility].  The patient is to continue on Coumadin for two additional weeks with [facility] to follow pro times.  PT and INR at time of discharge are 16.1 and 1.5.  The patient is discharged on 5 mg Coumadin a day. On April 11, 2000, the patient is discharged to home.  DISCHARGE MEDICATIONS:  1. Wellbutrin 150 mg b.i.d.  2. Coumadin 5 mg q.d.  3. Advair MDI 1 puff per day.  4. Hydrochlorothiazide 12.5 mg per day.  5. Protonix 40 mg per day.  6. Avapro 150 mg per day.  7. Maxzide 20 mg per day.  8. Provera 5 mg per day.  9. ______  250 mg q.d. 10. Toprol XL as at home. 11. Chromosin 41 b.i.d. 12. Lipitor 10 mg q.h.s. 13. ______ 14. Fosamax 700 mg each weekend.  ACTIVITY:  Use walker.  DIET:  Low fat.  SPECIAL INSTRUCTIONS:  No alcohol, no smoking, no driving.  [facility] to draw pro time on March 1.  Call results to Dr. Riley Kill.  FOLLOWUP:  The patient to follow up with Dr. Thurston Hole for staple removal. Followup with Dr. Riley Kill as needed. DD:  09/18/00 TD:  09/20/00 Job: [id number]

## 2010-07-01 NOTE — Op Note (Signed)
NAME:  [name], [name]                          ACCOUNT NO.:  [id number]   MEDICAL RECORD NO.:  [id number]                   PATIENT TYPE:  AMB   LOCATION:  SDC                                  FACILITY:  [facility]   PHYSICIAN:  James A. Ashley Royalty, M.D.             DATE OF BIRTH:  1935/05/04   DATE OF PROCEDURE:  09/23/2003  DATE OF DISCHARGE:                                 OPERATIVE REPORT   PREOPERATIVE DIAGNOSES:  Two questionable polyps on referring ultrasound and  the endometrial cavity.  No evidence of polyps or hysteroscopy. Path  pending.   POSTOPERATIVE DIAGNOSES:  Two questionable polyps on referring ultrasound  and the endometrial cavity.  No evidence of polyps or hysteroscopy. Path  pending.   PROCEDURE:  1. Diagnostic hysteroscopy.  2. Dilatation and curettage.   SURGEON:  Rudy Jew. Ashley Royalty, M.D.   ANESTHESIA:  General.   ESTIMATED BLOOD LOSS:  Less than 25 mL.   COMPLICATIONS:  None.   PACKS/DRAINS:  None.   DESCRIPTION OF PROCEDURE:  The patient was taken to the operating room and  placed in the dorsal supine position. After adequate general anesthesia was  administered, she was placed in the lithotomy position and prepped and  draped in the usual manner for vaginal surgery.  A posterior weighted  retractor was placed per vagina. The anterior lip of the cervix was grasped  with a single tooth tenaculum.  The uterus was gently sounded to  approximately 7 cm. The cervix was then dilated to a size 19 [place] using  [employer] dilators.  The diagnostic hysteroscope was then placed into the  uterine cavity using sorbitol as a distention medium.  An excellent view of  the uterine cavity was obtained.  The left and right tubal ostia were noted.  The anterior and posterior aspects of the uterine cavity were inspected  carefully. There was no evidence of any polyps as mentioned on the recent  ultrasound study.  Appropriate photos were obtained. The endocervical canal  was  similarly benign. At this point, the patient was felt to have benefitted  maximally from the hysteroscopy. The hysteroscope was removed.   Attention was then turned to the uterine curettage. A medium size curette  was introduced into the uterine cavity.  First a four quadrant technique was  utilized. The endotherapeutic technique was utilized.  All curettings were  submitted pathology for histologic studies.   At this point, the vaginal instruments were removed.  Hemostasis was noted  and the procedure terminated.   The patient was taken to the recovery room in excellent condition.                                               James A. Ashley Royalty, M.D.    JAM/MEDQ  D:  09/23/2003  T:  09/23/2003  Job:  [id number]   cc:   Lovenia Kim, D.O.  [address], [address]  [place]  [place] [address]  Fax: [phone]

## 2010-07-01 NOTE — Op Note (Signed)
[place]  Patient:    [name], [name]                       MRN: [id number] Proc. Date: 04/02/00 Adm. Date:  [id number] Attending:  Twana First                           Operative Report  PREOPERATIVE DIAGNOSIS:  Left knee DD:  04/02/00 TD:  04/02/00 Job: [id number]

## 2010-07-01 NOTE — H&P (Signed)
NAME:  [name], [name]                          ACCOUNT NO.:  [id number]   MEDICAL RECORD NO.:  [id number]                   PATIENT TYPE:  AMB   LOCATION:  SDC                                  [facility]  [facility]   PHYSICIAN:  James A. Ashley Royalty, M.D.             DATE OF BIRTH:  1935/10/15   DATE OF ADMISSION:  09/23/2003  DATE OF DISCHARGE:                                HISTORY & PHYSICAL   This is a 75 year old nulligravida referred to me for evaluation of possible  endometrial polyps observed as an incidental finding on an ultrasound  obtained September 02, 2003, at [facility] ordered by the referring  physician.  The reason for the study was postmenopausal status with a  fullness in the right adnexa.  The ultrasound revealed a uterus of 7 x 4 x 3  cm in diameter.  There were two discrete sessile polypoid lesions of 3 and 4  mm in diameter, respectively, noted.   MEDICATIONS:  Avapro, Toprol XL, Actonel, hydrochlorothiazide, Nexium,  Zetia, Flonase, Advair, Cymbalta.   ALLERGIES:  1. SULFA.  2. CARBOCAINE.  3. SHELL FISH.  4. INSECT STINGS.   PAST MEDICAL HISTORY:  1. Hypertension.  2. COPD.  3. GERD.  4. Anxiety/depression.  5. Osteoarthritis.  6. Hyperlipidemia.  7. Psoriasis.   PAST SURGICAL HISTORY:  1. Knee replacement.  2. Two breast biopsies.   FAMILY HISTORY:  Positive for breast carcinoma and hypertension.   SOCIAL HISTORY:  The patient smokes one pack of cigarettes per day.  She  denies the use of alcohol.   REVIEW OF SYMPTOMS:  Noncontributory.   PHYSICAL EXAMINATION:  GENERAL APPEARANCE:  A well-developed, well-  nourished, elderly white female in no acute distress.  VITAL SIGNS:  Afebrile.  Vital signs stable.  SKIN:  Warm and dry without lesions.  LYMPHS:  There are no supraclavicular, cervical or inguinal adenopathy.  HEENT:  Normocephalic.  NECK:  Supple without thyromegaly.  CHEST:  Lungs are clear.  CARDIOVASCULAR:  Regular rate and  rhythm without murmurs, rubs, or gallops.  BREASTS:  Examination is deferred.  ABDOMEN:  Soft and nontender without masses or organomegaly.  Bowel sounds  are active.  MUSCULOSKELETAL:  No CVA tenderness.  PELVIC:  External genitalia within normal limits.  Vagina and cervix without  gross lesions.  Bimanual examination reveals uterus to be approximately 8.4  cm but no adnexal masses are palpable.   IMPRESSION:  1. Two possible endometrial polyps noted on recent ultrasound as an     incidental finding.  2. Hypertension.  3. Chronic obstructive pulmonary disease.  4. Gastroesophageal reflux disease.  5. Anxiety/depression.  6. Osteoarthritis.  7. Hyperlipidemia.  8. Psoriasis.   PLAN:  1. Diagnostic/operative hysteroscopy.  2. Dilatation and curettage.   Risks, benefits, complications and alternatives were fully discussed with  the patient.  She states she understands and accepts.  Questions invited and  answered.  James A. Ashley Royalty, M.D.    JAM/MEDQ  D:  09/23/2003  T:  09/23/2003  Job:  [id number]

## 2010-10-20 ENCOUNTER — Other Ambulatory Visit: Payer: Self-pay | Admitting: *Deleted

## 2010-10-20 MED ORDER — DILTIAZEM HCL ER 240 MG PO CP24: 240.0000 mg | ORAL_CAPSULE | Freq: Every day | ORAL | Status: DC

## 2014-07-06 ENCOUNTER — Encounter: Payer: Self-pay | Admitting: Critical Care Medicine

## 2014-07-07 ENCOUNTER — Ambulatory Visit (INDEPENDENT_AMBULATORY_CARE_PROVIDER_SITE_OTHER): Payer: Medicare Other | Admitting: Critical Care Medicine

## 2014-07-07 ENCOUNTER — Encounter: Payer: Self-pay | Admitting: Critical Care Medicine

## 2014-07-07 VITALS — BP 116/62 | HR 67 | Temp 99.5°F | Ht 61.0 in | Wt 163.4 lb

## 2014-07-07 DIAGNOSIS — Z720 Tobacco use: Secondary | ICD-10-CM

## 2014-07-07 DIAGNOSIS — J449 Chronic obstructive pulmonary disease, unspecified: Secondary | ICD-10-CM

## 2014-07-07 MED ORDER — FLUTICASONE FUROATE-VILANTEROL 200-25 MCG/INH IN AEPB: 1.0000 | INHALATION_SPRAY | Freq: Every day | RESPIRATORY_TRACT | Status: AC

## 2014-07-07 MED ORDER — NICOTINE POLACRILEX 4 MG MT LOZG: LOZENGE | OROMUCOSAL | Status: DC

## 2014-07-07 NOTE — Patient Instructions (Signed)
Stop Flovent Start Breo one puff daily Stop smoking , use nicorette minis 4mg  lozenge 6-8 times per day Return 2 months [facility]

## 2014-07-07 NOTE — Progress Notes (Signed)
Subjective:    Patient ID: [name], female    DOB: 1935-08-29, 79 y.o.   MRN: [id number]  HPI Comments: Referral for dyspnea,  Slowly worsening. Previous pt of [facility] two years ago.  Nodules seen on R upper lobe area>>> f/u CTs and Fob with biopsy>>neg for CA.  Dx asthmatic bronchitis. No dx of emphysema rendered. Pt on cpap with pos sleep study.    Shortness of Breath This is a chronic problem. The current episode started more than 1 year ago. The problem occurs daily (exertional only, pt gets Doe < 125ft, worse up incline and stairs). The problem has been gradually worsening. Associated symptoms include a fever, leg pain, leg swelling, neck pain, sputum production and wheezing. Pertinent negatives include no abdominal pain, chest pain, ear pain, headaches, hemoptysis, orthopnea, PND, rash, rhinorrhea, sore throat or vomiting. The symptoms are aggravated by any activity, exercise, lying flat and URIs. Associated symptoms comments: Bruise easily Mucus in AM only, brown to white  Notes muscle weakness and push self and gets DOE. Risk factors include smoking (currently smoking:  1PPD). She has tried beta agonist inhalers and steroid inhalers for the symptoms. The treatment provided moderate relief. Her past medical history is significant for asthma, chronic lung disease and COPD. There is no history of bronchiolitis, DVT, PE or pneumonia. (PMR on pred . Osteoporosis, RA, CKD, lots of stress, lost son and husband, lives alone. )   Past Medical History  Diagnosis Date  . GERD (gastroesophageal reflux disease)   . COPD (chronic obstructive pulmonary disease)   . Sleep apnea   . Dyspnea   . CAD (coronary artery disease)     mild non obstructive per cardiac cath in 02/2010  . Atrial tachycardia   . HLD (hyperlipidemia)   . HTN (hypertension)   . Tobacco abuse   . Asthma   . Hemorrhoids   . Chronic renal failure   . Osteoarthritis   . Bisphosphonate-associated osteonecrosis of the jaw     . Hip pain   . Gout   . Kidney failure      Family History  Problem Relation Age of Onset  . Rheum arthritis Father      History   Social History  . Marital Status: Divorced    Spouse Name: N/A  . Number of Children: N/A  . Years of Education: N/A   Occupational History  . Not on file.   Social History Main Topics  . Smoking status: Current Every Day Smoker -- 1.00 packs/day for 60 years    Types: Cigarettes  . Smokeless tobacco: Never Used  . Alcohol Use: No  . Drug Use: No  . Sexual Activity: Not on file   Other Topics Concern  . Not on file   Social History Narrative   Lives alone.   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     Allergies  Allergen Reactions  . Bee Venom   . Carbocaine [Mepivacaine Hcl]   . Shellfish-Derived Products   . Sulfa Antibiotics   . Zetia [Ezetimibe]      Outpatient Prescriptions Prior to Visit  Medication Sig Dispense Refill  . albuterol (VENTOLIN HFA) 108 (90 BASE) MCG/ACT inhaler Inhale 2 puffs into the lungs every 6 (six) hours as needed.      Marland Kitchen aspirin 81 MG tablet Take 81 mg by mouth daily.      Marland Kitchen Bioflavonoid Products (ESTER C PO) Take by mouth daily.      Marland Kitchen  Calcium Carbonate-Vitamin D (CALCIUM + D) 600-200 MG-UNIT TABS Take by mouth daily.      . cholecalciferol (VITAMIN D) 1000 UNITS tablet Take 1,000 Units by mouth daily.      . cycloSPORINE (RESTASIS) 0.05 % ophthalmic emulsion 1 drop 2 (two) times daily.      . DULoxetine (CYMBALTA) 60 MG capsule Take 60 mg by mouth daily.      . fluticasone (FLONASE) 50 MCG/ACT nasal spray 2 sprays by Nasal route daily.      . Multiple Vitamin (MULTIVITAMIN) tablet Take 1 tablet by mouth daily.      . rosuvastatin (CRESTOR) 5 MG tablet Take 5 mg by mouth daily.     Marland Kitchen. olmesartan (BENICAR) 40 MG tablet Take 1 tablet (40 mg total) by mouth daily. 30 tablet 11  . beclomethasone (QVAR) 80 MCG/ACT inhaler Inhale 1 puff into the lungs 2 (two) times daily.      . colesevelam  (WELCHOL) 625 MG tablet Take 1,250 mg by mouth 2 (two) times daily with a meal.      . diltiazem (DILACOR XR) 240 MG 24 hr capsule Take 1 capsule (240 mg total) by mouth daily. (Patient not taking: Reported on 07/07/2014) 30 capsule 6  . furosemide (LASIX) 20 MG tablet Take 20 mg by mouth daily.      . metoprolol (TOPROL-XL) 50 MG 24 hr tablet Take 50 mg by mouth daily.      Marland Kitchen. omeprazole-sodium bicarbonate (ZEGERID) 40-1100 MG per capsule Take 1 capsule by mouth 2 (two) times daily.       No facility-administered medications prior to visit.       Review of Systems  Constitutional: Positive for fever. Negative for chills, diaphoresis, appetite change, fatigue and unexpected weight change.  HENT: Negative for congestion, ear discharge, ear pain, hearing loss, nosebleeds, postnasal drip, rhinorrhea, sinus pressure, sneezing, sore throat, trouble swallowing and voice change.   Eyes: Negative for discharge and itching.  Respiratory: Positive for cough, sputum production, shortness of breath and wheezing. Negative for apnea, hemoptysis, choking, chest tightness and stridor.   Cardiovascular: Positive for leg swelling. Negative for chest pain, palpitations, orthopnea and PND.  Gastrointestinal: Negative.  Negative for nausea, vomiting, abdominal pain and abdominal distention.  Endocrine: Negative.   Genitourinary: Negative.   Musculoskeletal: Positive for neck pain. Negative for myalgias, joint swelling and arthralgias.  Skin: Negative for rash.  Allergic/Immunologic: Negative for environmental allergies.  Neurological: Negative for dizziness, syncope, weakness and headaches.  Hematological: Negative for adenopathy. Does not bruise/bleed easily.  Psychiatric/Behavioral: Negative for sleep disturbance and agitation. The patient is not nervous/anxious.        Objective:   Physical Exam  Filed Vitals:   07/07/14 1459  BP: 116/62  Pulse: 67  Temp: 99.5 F (37.5 C)  TempSrc: Oral  Height:  5\' 1"  (1.549 m)  Weight: 163 lb 6.4 oz (74.118 kg)  SpO2: 98%    Gen: Pleasant, well-nourished, in no distress,  normal affect  ENT: No lesions,  mouth clear,  oropharynx clear, no postnasal drip  Neck: No JVD, no TMG, no carotid bruits  Lungs: No use of accessory muscles, no dullness to percussion, distant BS  Cardiovascular: RRR, heart sounds normal, no murmur or gallops, no peripheral edema  Abdomen: soft and NT, no HSM,  BS normal  Musculoskeletal: No deformities, no cyanosis or clubbing  Neuro: alert, non focal  Skin: Warm, no lesions or rashes  No results found.  Assessment & Plan:  I personally reviewed all images and lab data in the [facility] system as well as any outside material available during this office visit and agree with the  radiology impressions.   COPD with chronic bronchitis Gold C Copd Copd gold c with chronic bronchitis and flare Needs recoreds from [facility] Need to stop smoking  Stop Flovent Start Breo one puff daily Stop smoking , use nicorette minis  lozenge 6-8 times per day Return 2 months Records from [facility]    Tobacco abuse Ongoing tobacco abuse  Smoking cessation 10 min counseling issued     Frimy was seen today for pulmonary consult.  Diagnoses and all orders for this visit:  COPD with chronic bronchitis  Tobacco abuse  COPD with chronic bronchitis Gold C Copd  Other orders -     Fluticasone Furoate-Vilanterol (BREO ELLIPTA) 200-25 MCG/INH AEPB; Inhale 1 puff into the lungs daily. -     nicotine polacrilex (NICORETTE) 4 MG lozenge; Use 6-8 per day    I had an extended discussion with the patient and or family lasting 10 minutes of a 25 minute visit including:  Regarding smoking cessation

## 2014-07-08 DIAGNOSIS — J449 Chronic obstructive pulmonary disease, unspecified: Secondary | ICD-10-CM | POA: Insufficient documentation

## 2014-07-08 NOTE — Assessment & Plan Note (Signed)
Copd gold c with chronic bronchitis and flare Needs recoreds from [facility] Need to stop smoking  Stop Flovent Start Breo one puff daily Stop smoking , use nicorette minis 4mg  lozenge 6-8 times per day [facility]

## 2014-07-08 NOTE — Assessment & Plan Note (Signed)
Ongoing tobacco abuse  Smoking cessation 10 min counseling issued

## 2014-09-08 ENCOUNTER — Encounter: Payer: Self-pay | Admitting: Critical Care Medicine

## 2014-09-08 ENCOUNTER — Ambulatory Visit (INDEPENDENT_AMBULATORY_CARE_PROVIDER_SITE_OTHER): Payer: Medicare Other | Admitting: Critical Care Medicine

## 2014-09-08 VITALS — BP 142/78 | HR 70 | Temp 99.2°F | Ht 62.0 in | Wt 167.6 lb

## 2014-09-08 DIAGNOSIS — Z720 Tobacco use: Secondary | ICD-10-CM

## 2014-09-08 DIAGNOSIS — J209 Acute bronchitis, unspecified: Secondary | ICD-10-CM

## 2014-09-08 DIAGNOSIS — J449 Chronic obstructive pulmonary disease, unspecified: Secondary | ICD-10-CM

## 2014-09-08 MED ORDER — PREDNISONE 10 MG PO TABS: ORAL_TABLET | ORAL | Status: DC

## 2014-09-08 MED ORDER — LEVOFLOXACIN 500 MG PO TABS: 500.0000 mg | ORAL_TABLET | Freq: Every day | ORAL | Status: DC

## 2014-09-08 MED ORDER — PREDNISONE 5 MG PO TABS: ORAL_TABLET | ORAL | Status: DC

## 2014-09-08 MED ORDER — FUROSEMIDE 20 MG PO TABS: 20.0000 mg | ORAL_TABLET | Freq: Every day | ORAL | Status: AC | PRN

## 2014-09-08 NOTE — Progress Notes (Signed)
Subjective:    Patient ID: [name], female    DOB: December 24, 1935, 79 y.o.   MRN: [id number]  HPI 09/08/2014 Chief Complaint  Patient presents with  . Follow-up    Doesn't feel as congested,but c/o feels more sob x 2-3 wks.,having stomach problems,ankle and leg edema,occass wheezing, occass. cough-lt.brown,no blood,late in evening feels midchest tightness,no fcs.Nausea and diarrhea 3-4 wks. ago bp low,went to [facility] blood when went to [facility] for bowel movement 2 days ago,feels a lot of indigestion,PND    Notes more dyspnea for 3 weeks.  Coughing up brown mucus .  Recent ED visit with diarrhea and low BP 3 weeks ago.  No is better. Rx Cipro per EDP. Now with edema in feet and BP is high. Notes some wheezing. Virgel Bouquet has helped. Notes some pndrip.  Notes indigestion Pt denies any significant sore throat, nasal congestion or excess secretions, fever, chills, sweats, unintended weight loss, pleurtic or exertional chest pain, orthopnea PND, or leg swelling Pt denies any increase in rescue therapy over baseline, denies waking up needing it or having any early am or nocturnal exacerbations of coughing/wheezing/or dyspnea. Pt also denies any obvious fluctuation in symptoms with  weather or environmental change or other alleviating or aggravating factors   Current Medications, Allergies, Complete Past Medical History, Past Surgical History, Family History, and Social History were reviewed in [facility] electronic medical record per todays encounter:  09/08/2014  Review of Systems  Constitutional: Negative.   HENT: Negative.  Negative for ear pain, postnasal drip, rhinorrhea, sinus pressure, sore throat, trouble swallowing and voice change.   Eyes: Negative.   Respiratory: Positive for cough, choking, chest tightness, shortness of breath and wheezing. Negative for apnea and stridor.   Cardiovascular: Negative.  Negative for chest pain, palpitations and leg swelling.    Gastrointestinal: Negative.  Negative for nausea, vomiting, abdominal pain and abdominal distention.  Genitourinary: Negative.   Musculoskeletal: Negative.  Negative for myalgias and arthralgias.  Skin: Negative.  Negative for rash.  Allergic/Immunologic: Negative.  Negative for environmental allergies and food allergies.  Neurological: Negative.  Negative for dizziness, syncope, weakness and headaches.  Hematological: Negative.  Negative for adenopathy. Does not bruise/bleed easily.  Psychiatric/Behavioral: Negative.  Negative for sleep disturbance and agitation. The patient is not nervous/anxious.        Objective:   Physical Exam Filed Vitals:   09/08/14 1411 09/08/14 1413  BP:  142/78  Pulse:  70  Temp:  99.2 F (37.3 C)  TempSrc:  Oral  Height:  (1.575 m)  (1.575 m)  Weight: 167 lb 9.6 oz (76.023 kg) 167 lb 9.6 oz (76.023 kg)  SpO2:  96%    Gen: Pleasant, well-nourished, in no distress,  normal affect  ENT: No lesions,  mouth clear,  oropharynx clear, no postnasal drip  Neck: No JVD, no TMG, no carotid bruits  Lungs: No use of accessory muscles, no dullness to percussion, exp wheezes, poor airflow   Cardiovascular: RRR, heart sounds normal, no murmur or gallops, no peripheral edema  Abdomen: soft and NT, no HSM,  BS normal  Musculoskeletal: No deformities, no cyanosis or clubbing  Neuro: alert, non focal  Skin: Warm, no lesions or rashes  No results found.        Assessment & Plan:  I personally reviewed all images and lab data in the [facility] system as well as any outside material available during this office visit and agree with the  radiology impressions.  COPD with chronic bronchitis Gold C Copd COPD gold stage C Now with acute exacerbation with acute tracheobronchitis and ongoing lower extremity edema Plan Take levaquin one daily for 5 days Take prednisone 10mg  Take 4 tablets daily for 5 days then resume prednisone at 9mg  daily No change in  Breo dose Furosemide 20mg  daily as needed for edema/swelling in legs Return 1 month   Tobacco abuse Ongoing tobacco use Greater than 10 minutes smoking cessation counseling given to the patient    Oneda was seen today for follow-up.  Diagnoses and all orders for this visit:  COPD with chronic bronchitis  Acute bronchitis, unspecified organism  Tobacco abuse  Other orders -     predniSONE (DELTASONE) 10 MG tablet; Take 4 tablets daily for 5 days then stop -     levofloxacin (LEVAQUIN) 500 MG tablet; Take 1 tablet (500 mg total) by mouth daily. -     furosemide (LASIX) 20 MG tablet; Take 1 tablet (20 mg total) by mouth daily as needed for edema (ankle edema). -     predniSONE (DELTASONE) 5 MG tablet; HOLD while on 10mg  prednisone then resume when 10mg  prednisone completed and then Take 9 mg everyday by mouth.

## 2014-09-08 NOTE — Assessment & Plan Note (Signed)
Ongoing tobacco use Greater than 10 minutes smoking cessation counseling given to the patient

## 2014-09-08 NOTE — Patient Instructions (Signed)
Take levaquin one daily for 5 days Take prednisone  Take 4 tablets daily for 5 days then resume prednisone at  daily No change in Breo dose Furosemide  daily as needed for edema/swelling in legs Return 1 month

## 2014-09-08 NOTE — Assessment & Plan Note (Signed)
COPD gold stage C Now with acute exacerbation with acute tracheobronchitis and ongoing lower extremity edema Plan Take levaquin one daily for 5 days Take prednisone  Take 4 tablets daily for 5 days then resume prednisone at  daily No change in Breo dose Furosemide  daily as needed for edema/swelling in legs Return 1 month

## 2014-10-06 ENCOUNTER — Encounter: Payer: Self-pay | Admitting: Critical Care Medicine

## 2014-10-06 ENCOUNTER — Ambulatory Visit (INDEPENDENT_AMBULATORY_CARE_PROVIDER_SITE_OTHER): Payer: Medicare Other | Admitting: Critical Care Medicine

## 2014-10-06 VITALS — BP 138/82 | HR 70 | Temp 98.9°F | Ht 61.5 in | Wt 164.0 lb

## 2014-10-06 DIAGNOSIS — J449 Chronic obstructive pulmonary disease, unspecified: Secondary | ICD-10-CM

## 2014-10-06 NOTE — Patient Instructions (Signed)
Stay on Breo and Spiriva May hold lasix for now, resume daily as needed for 2 pound weight gain or significant lower extremity swelling Consider changing labetalol to bystolic  daily or zebeta  Return 4 months

## 2014-10-06 NOTE — Assessment & Plan Note (Signed)
Gold stage C COPD with associated  Chronic bronchitis  No oxygen is necessary  Adverse side effect due to beta blocker and its non-selectivity  Plan  Reduce Lasix to as needed  I would give consideration to changing labetalol to either Bystolic or Zebeta which is more selective  Continue Breo 1 puff daily  Continue Spiriva daily

## 2014-10-06 NOTE — Progress Notes (Signed)
Subjective:    Patient ID: [name], female    DOB: 02-28-1935, 79 y.o.   MRN: [id number]  HPI 10/06/2014 Chief Complaint  Patient presents with  . 1 month follow up    Still having SOB.  PND, sinus HA, and cough with clear mucus.    Dyspnea is better, notes some post nasal drip.  When labetalol dose reduced dyspnea was better.  Also legs were weak , now is better with walking. Notes a cough with clear mucus.  Since the last visit the patient was seen by primary care  And did have the dose of labetalol reduced.   Patient notes edema has improved on Lasix when necessary.  Cough is minimally productive. There is no fever chills or sweats.  Current Medications, Allergies, Complete Past Medical History, Past Surgical History, Family History, and Social History were reviewed in [employer] [facility] electronic medical record per todays encounter:  10/06/2014  Review of Systems  Constitutional: Negative.   HENT: Negative.  Negative for ear pain, postnasal drip, rhinorrhea, sinus pressure, sore throat, trouble swallowing and voice change.   Eyes: Negative.   Respiratory: Positive for cough, chest tightness, shortness of breath and wheezing. Negative for apnea, choking and stridor.   Cardiovascular: Positive for palpitations. Negative for chest pain and leg swelling.  Gastrointestinal: Negative.  Negative for nausea, vomiting, abdominal pain and abdominal distention.  Genitourinary: Negative.   Musculoskeletal: Negative.  Negative for myalgias and arthralgias.  Skin: Negative.  Negative for rash.  Allergic/Immunologic: Negative.  Negative for environmental allergies and food allergies.  Neurological: Negative.  Negative for dizziness, syncope, weakness and headaches.  Hematological: Negative.  Negative for adenopathy. Does not bruise/bleed easily.  Psychiatric/Behavioral: Negative.  Negative for sleep disturbance and agitation. The patient is not nervous/anxious.        Objective:   Physical Exam Filed Vitals:   10/06/14 1457  BP: 138/82  Pulse: 70  Temp: 98.9 F (37.2 C)  TempSrc: Oral  Height: 5' 1.5" (1.562 m)  Weight: 164 lb (74.39 kg)  SpO2: 97%    Gen: Pleasant, well-nourished, in no distress,  normal affect  ENT: No lesions,  mouth clear,  oropharynx clear, no postnasal drip  Neck: No JVD, no TMG, no carotid bruits  Lungs: No use of accessory muscles, no dullness to percussion, distant breath sounds a few expiratory squeaks  Cardiovascular: RRR, heart sounds normal, no murmur or gallops,  mild peripheral edema  Abdomen: soft and NT, no HSM,  BS normal  Musculoskeletal: No deformities, no cyanosis or clubbing  Neuro: alert, non focal  Skin: Warm, no lesions or rashes  No results found.        Assessment & Plan:  I personally reviewed all images and lab data in the [facility] system as well as any outside material available during this office visit and agree with the  radiology impressions.   COPD with chronic bronchitis Gold C Copd  Gold stage C COPD with associated  Chronic bronchitis  No oxygen is necessary  Adverse side effect due to beta blocker and its non-selectivity  Plan  Reduce Lasix to as needed  I would give consideration to changing labetalol to either Bystolic or Zebeta which is more selective  Continue Breo 1 puff daily  Continue Spiriva daily     [name] was seen today for 1 month follow up.  Diagnoses and all orders for this visit:  COPD with chronic bronchitis Gold C Copd    I had an  extended discussion with the patient and or family lasting 10 minutes of a 25 minute visit including:  Dx, need for alternative to labetalol

## 2015-01-25 ENCOUNTER — Other Ambulatory Visit: Payer: Self-pay | Admitting: Critical Care Medicine

## 2015-12-02 DIAGNOSIS — J449 Chronic obstructive pulmonary disease, unspecified: Secondary | ICD-10-CM | POA: Diagnosis not present

## 2015-12-02 DIAGNOSIS — M1990 Unspecified osteoarthritis, unspecified site: Secondary | ICD-10-CM

## 2015-12-02 DIAGNOSIS — I10 Essential (primary) hypertension: Secondary | ICD-10-CM

## 2015-12-02 DIAGNOSIS — I4891 Unspecified atrial fibrillation: Secondary | ICD-10-CM

## 2015-12-02 DIAGNOSIS — N179 Acute kidney failure, unspecified: Secondary | ICD-10-CM | POA: Diagnosis not present

## 2015-12-03 DIAGNOSIS — J449 Chronic obstructive pulmonary disease, unspecified: Secondary | ICD-10-CM | POA: Diagnosis not present

## 2015-12-03 DIAGNOSIS — N179 Acute kidney failure, unspecified: Secondary | ICD-10-CM | POA: Diagnosis not present

## 2015-12-03 DIAGNOSIS — I4891 Unspecified atrial fibrillation: Secondary | ICD-10-CM | POA: Diagnosis not present

## 2015-12-03 DIAGNOSIS — I10 Essential (primary) hypertension: Secondary | ICD-10-CM | POA: Diagnosis not present

## 2015-12-04 DIAGNOSIS — J449 Chronic obstructive pulmonary disease, unspecified: Secondary | ICD-10-CM | POA: Diagnosis not present

## 2015-12-04 DIAGNOSIS — I10 Essential (primary) hypertension: Secondary | ICD-10-CM | POA: Diagnosis not present

## 2015-12-04 DIAGNOSIS — N179 Acute kidney failure, unspecified: Secondary | ICD-10-CM | POA: Diagnosis not present

## 2015-12-04 DIAGNOSIS — I4891 Unspecified atrial fibrillation: Secondary | ICD-10-CM | POA: Diagnosis not present

## 2015-12-05 DIAGNOSIS — N179 Acute kidney failure, unspecified: Secondary | ICD-10-CM | POA: Diagnosis not present

## 2015-12-05 DIAGNOSIS — I4891 Unspecified atrial fibrillation: Secondary | ICD-10-CM | POA: Diagnosis not present

## 2015-12-05 DIAGNOSIS — I10 Essential (primary) hypertension: Secondary | ICD-10-CM | POA: Diagnosis not present

## 2015-12-05 DIAGNOSIS — J449 Chronic obstructive pulmonary disease, unspecified: Secondary | ICD-10-CM | POA: Diagnosis not present

## 2015-12-10 ENCOUNTER — Encounter (HOSPITAL_COMMUNITY): Payer: Self-pay

## 2015-12-10 ENCOUNTER — Inpatient Hospital Stay (HOSPITAL_COMMUNITY)
Admission: EM | Admit: 2015-12-10 | Discharge: 2015-12-13 | DRG: 394 | Disposition: A | Discharge status: Home Health | Payer: Medicare Other | Attending: Internal Medicine | Admitting: Internal Medicine

## 2015-12-10 DIAGNOSIS — E876 Hypokalemia: Secondary | ICD-10-CM | POA: Diagnosis present

## 2015-12-10 DIAGNOSIS — G4700 Insomnia, unspecified: Secondary | ICD-10-CM | POA: Diagnosis present

## 2015-12-10 DIAGNOSIS — K439 Ventral hernia without obstruction or gangrene: Secondary | ICD-10-CM | POA: Diagnosis present

## 2015-12-10 DIAGNOSIS — K219 Gastro-esophageal reflux disease without esophagitis: Secondary | ICD-10-CM | POA: Diagnosis present

## 2015-12-10 DIAGNOSIS — K922 Gastrointestinal hemorrhage, unspecified: Secondary | ICD-10-CM | POA: Diagnosis present

## 2015-12-10 DIAGNOSIS — N184 Chronic kidney disease, stage 4 (severe): Secondary | ICD-10-CM | POA: Diagnosis present

## 2015-12-10 DIAGNOSIS — Z7982 Long term (current) use of aspirin: Secondary | ICD-10-CM | POA: Diagnosis not present

## 2015-12-10 DIAGNOSIS — Z79899 Other long term (current) drug therapy: Secondary | ICD-10-CM

## 2015-12-10 DIAGNOSIS — I129 Hypertensive chronic kidney disease with stage 1 through stage 4 chronic kidney disease, or unspecified chronic kidney disease: Secondary | ICD-10-CM | POA: Diagnosis present

## 2015-12-10 DIAGNOSIS — I4891 Unspecified atrial fibrillation: Secondary | ICD-10-CM | POA: Diagnosis present

## 2015-12-10 DIAGNOSIS — K449 Diaphragmatic hernia without obstruction or gangrene: Secondary | ICD-10-CM | POA: Diagnosis present

## 2015-12-10 DIAGNOSIS — E785 Hyperlipidemia, unspecified: Secondary | ICD-10-CM | POA: Diagnosis present

## 2015-12-10 DIAGNOSIS — G4733 Obstructive sleep apnea (adult) (pediatric): Secondary | ICD-10-CM | POA: Diagnosis present

## 2015-12-10 DIAGNOSIS — N179 Acute kidney failure, unspecified: Secondary | ICD-10-CM | POA: Diagnosis present

## 2015-12-10 DIAGNOSIS — Z7901 Long term (current) use of anticoagulants: Secondary | ICD-10-CM | POA: Diagnosis not present

## 2015-12-10 DIAGNOSIS — D509 Iron deficiency anemia, unspecified: Secondary | ICD-10-CM | POA: Diagnosis present

## 2015-12-10 DIAGNOSIS — R55 Syncope and collapse: Secondary | ICD-10-CM

## 2015-12-10 DIAGNOSIS — J449 Chronic obstructive pulmonary disease, unspecified: Secondary | ICD-10-CM | POA: Diagnosis present

## 2015-12-10 DIAGNOSIS — K642 Third degree hemorrhoids: Principal | ICD-10-CM

## 2015-12-10 DIAGNOSIS — N183 Chronic kidney disease, stage 3 unspecified: Secondary | ICD-10-CM | POA: Diagnosis present

## 2015-12-10 DIAGNOSIS — E669 Obesity, unspecified: Secondary | ICD-10-CM | POA: Diagnosis present

## 2015-12-10 DIAGNOSIS — Z7951 Long term (current) use of inhaled steroids: Secondary | ICD-10-CM

## 2015-12-10 DIAGNOSIS — K921 Melena: Secondary | ICD-10-CM | POA: Diagnosis present

## 2015-12-10 DIAGNOSIS — M1990 Unspecified osteoarthritis, unspecified site: Secondary | ICD-10-CM | POA: Diagnosis present

## 2015-12-10 DIAGNOSIS — K5730 Diverticulosis of large intestine without perforation or abscess without bleeding: Secondary | ICD-10-CM | POA: Diagnosis present

## 2015-12-10 DIAGNOSIS — K625 Hemorrhage of anus and rectum: Secondary | ICD-10-CM | POA: Diagnosis not present

## 2015-12-10 DIAGNOSIS — I10 Essential (primary) hypertension: Secondary | ICD-10-CM | POA: Diagnosis present

## 2015-12-10 DIAGNOSIS — K644 Residual hemorrhoidal skin tags: Secondary | ICD-10-CM | POA: Diagnosis present

## 2015-12-10 LAB — COMPREHENSIVE METABOLIC PANEL
ALBUMIN: 2.7 g/dL — AB (ref 3.5–5.0)
ALT: 38 U/L (ref 14–54)
AST: 30 U/L (ref 15–41)
Alkaline Phosphatase: 57 U/L (ref 38–126)
Anion gap: 13 (ref 5–15)
BUN: 87 mg/dL — AB (ref 6–20)
CHLORIDE: 95 mmol/L — AB (ref 101–111)
CO2: 24 mmol/L (ref 22–32)
Calcium: 9.7 mg/dL (ref 8.9–10.3)
Creatinine, Ser: 4.68 mg/dL — ABNORMAL HIGH (ref 0.44–1.00)
GFR calc Af Amer: 9 mL/min — ABNORMAL LOW (ref >60)
GFR calc non Af Amer: 8 mL/min — ABNORMAL LOW (ref >60)
GLUCOSE: 110 mg/dL — AB (ref 65–99)
POTASSIUM: 3.7 mmol/L (ref 3.5–5.1)
Sodium: 132 mmol/L — ABNORMAL LOW (ref 135–145)
Total Bilirubin: 0.6 mg/dL (ref 0.3–1.2)
Total Protein: 5.6 g/dL — ABNORMAL LOW (ref 6.5–8.1)

## 2015-12-10 LAB — CBC WITH DIFFERENTIAL/PLATELET
BASOS ABS: 0 10*3/uL (ref 0.0–0.1)
BASOS PCT: 0 %
EOS PCT: 0 %
Eosinophils Absolute: 0 10*3/uL (ref 0.0–0.7)
HCT: 32.4 % — ABNORMAL LOW (ref 36.0–46.0)
Hemoglobin: 10.8 g/dL — ABNORMAL LOW (ref 12.0–15.0)
Lymphocytes Relative: 5 %
Lymphs Abs: 0.9 10*3/uL (ref 0.7–4.0)
MCH: 29.5 pg (ref 26.0–34.0)
MCHC: 33.3 g/dL (ref 30.0–36.0)
MCV: 88.5 fL (ref 78.0–100.0)
MONO ABS: 0.9 10*3/uL (ref 0.1–1.0)
Monocytes Relative: 5 %
NEUTROS ABS: 15.5 10*3/uL — AB (ref 1.7–7.7)
Neutrophils Relative %: 90 %
PLATELETS: 310 10*3/uL (ref 150–400)
RBC: 3.66 MIL/uL — ABNORMAL LOW (ref 3.87–5.11)
RDW: 14.4 % (ref 11.5–15.5)
WBC: 17.2 10*3/uL — AB (ref 4.0–10.5)

## 2015-12-10 LAB — PREPARE RBC (CROSSMATCH)

## 2015-12-10 LAB — CBC
HCT: 34.6 % — ABNORMAL LOW (ref 36.0–46.0)
Hemoglobin: 11.3 g/dL — ABNORMAL LOW (ref 12.0–15.0)
MCH: 29 pg (ref 26.0–34.0)
MCHC: 32.7 g/dL (ref 30.0–36.0)
MCV: 88.9 fL (ref 78.0–100.0)
PLATELETS: 263 10*3/uL (ref 150–400)
RBC: 3.89 MIL/uL (ref 3.87–5.11)
RDW: 14.6 % (ref 11.5–15.5)
WBC: 14.3 10*3/uL — AB (ref 4.0–10.5)

## 2015-12-10 LAB — TROPONIN I: Troponin I: 0.08 ng/mL (ref <0.03)

## 2015-12-10 LAB — ABO/RH: ABO/RH(D): O POS

## 2015-12-10 MED ORDER — FLUTICASONE FUROATE-VILANTEROL 200-25 MCG/INH IN AEPB
1.0000 | INHALATION_SPRAY | Freq: Every day | RESPIRATORY_TRACT | Status: DC
  Administered 2015-12-10 – 2015-12-13 (×4): 1 via RESPIRATORY_TRACT
  Filled 2015-12-10: qty 28

## 2015-12-10 MED ORDER — SODIUM CHLORIDE 0.9 % IV SOLN
INTRAVENOUS | Status: DC
  Administered 2015-12-11 – 2015-12-12 (×3): 125 mL/h via INTRAVENOUS

## 2015-12-10 MED ORDER — ALPRAZOLAM 0.5 MG PO TABS
0.5000 mg | ORAL_TABLET | Freq: Every day | ORAL | Status: DC
  Administered 2015-12-10 – 2015-12-12 (×3): 0.5 mg via ORAL
  Filled 2015-12-10 (×3): qty 1

## 2015-12-10 MED ORDER — TRAMADOL HCL 50 MG PO TABS
50.0000 mg | ORAL_TABLET | Freq: Four times a day (QID) | ORAL | Status: DC | PRN
  Administered 2015-12-10 – 2015-12-12 (×3): 50 mg via ORAL
  Filled 2015-12-10 (×3): qty 1

## 2015-12-10 MED ORDER — DULOXETINE HCL 60 MG PO CPEP
60.0000 mg | ORAL_CAPSULE | Freq: Every day | ORAL | Status: DC
  Administered 2015-12-10 – 2015-12-13 (×4): 60 mg via ORAL
  Filled 2015-12-10 (×4): qty 1

## 2015-12-10 MED ORDER — SODIUM CHLORIDE 0.9 % IV SOLN
Freq: Once | INTRAVENOUS | Status: AC
  Administered 2015-12-10: 10 mL/h via INTRAVENOUS

## 2015-12-10 MED ORDER — SODIUM CHLORIDE 0.9 % IV BOLUS (SEPSIS)
1000.0000 mL | Freq: Once | INTRAVENOUS | Status: AC
  Administered 2015-12-10: 1000 mL via INTRAVENOUS

## 2015-12-10 MED ORDER — FLUTICASONE PROPIONATE 50 MCG/ACT NA SUSP
2.0000 | Freq: Every day | NASAL | Status: DC
  Administered 2015-12-10 – 2015-12-13 (×4): 2 via NASAL
  Filled 2015-12-10: qty 16

## 2015-12-10 MED ORDER — SODIUM CHLORIDE 0.9% FLUSH
3.0000 mL | Freq: Two times a day (BID) | INTRAVENOUS | Status: DC
  Administered 2015-12-10 – 2015-12-13 (×6): 3 mL via INTRAVENOUS

## 2015-12-10 MED ORDER — ALBUTEROL SULFATE (2.5 MG/3ML) 0.083% IN NEBU: 2.5000 mg | INHALATION_SOLUTION | Freq: Four times a day (QID) | RESPIRATORY_TRACT | Status: DC | PRN

## 2015-12-10 MED ORDER — CYCLOSPORINE 0.05 % OP EMUL
1.0000 [drp] | Freq: Two times a day (BID) | OPHTHALMIC | Status: DC
  Administered 2015-12-10 – 2015-12-13 (×5): 1 [drp] via OPHTHALMIC
  Filled 2015-12-10 (×7): qty 1

## 2015-12-10 MED ORDER — SODIUM CHLORIDE 0.9 % IV SOLN
Freq: Once | INTRAVENOUS | Status: AC
  Administered 2015-12-10: 16:00:00 via INTRAVENOUS

## 2015-12-10 MED ORDER — PANTOPRAZOLE SODIUM 40 MG PO TBEC: 40.0000 mg | DELAYED_RELEASE_TABLET | Freq: Every day | ORAL | Status: DC

## 2015-12-10 MED ORDER — ROSUVASTATIN CALCIUM 10 MG PO TABS
5.0000 mg | ORAL_TABLET | Freq: Every day | ORAL | Status: DC
  Administered 2015-12-10 – 2015-12-12 (×3): 5 mg via ORAL
  Filled 2015-12-10 (×3): qty 1

## 2015-12-10 MED ORDER — TIOTROPIUM BROMIDE MONOHYDRATE 18 MCG IN CAPS
18.0000 ug | ORAL_CAPSULE | Freq: Every day | RESPIRATORY_TRACT | Status: DC
  Administered 2015-12-10 – 2015-12-13 (×4): 18 ug via RESPIRATORY_TRACT
  Filled 2015-12-10: qty 5

## 2015-12-10 MED ORDER — PANTOPRAZOLE SODIUM 40 MG IV SOLR
40.0000 mg | Freq: Two times a day (BID) | INTRAVENOUS | Status: DC
  Administered 2015-12-10: 40 mg via INTRAVENOUS
  Filled 2015-12-10: qty 40

## 2015-12-10 NOTE — H&P (Signed)
Date: 12/10/2015               Patient Name:  [name] MRN: [id number]  DOB: 02-Mar-1935 Age / Sex: 80 y.o., female   PCP: Blane OharaKirsten Cox, MD         Medical Service: Internal Medicine Teaching Service         Attending Physician: Dr. Carlynn PurlErik Hoffman     First Contact: Dr. Eulah PontNina Duncan Alejandro  Pager: [phone]  Second Contact: Dr. Valentino NoseNathan Boswell Pager: [phone]       After Hours (After 5p/  First Contact Pager: [phone]  weekends / holidays): Second Contact Pager: [phone]   Chief Complaint: bright red blood in stool, shortness of breath, weakness   History of Present Illness: [name] is a 80 y.o. female with a PMH of CKD, ruptured abdominal aortic aneurism (10/2014), ventral hernia, new onset atrial fibrillation, external hemorrhoids, arthritis, iron deficiency anemia, obstructive sleep apnea, COPD, GERD who presents with diarrhea with bright red blood, shortness of breath, and weakness. These symptoms began last Thursday when she was admitted to [facility] for shortness of breath and instability while walking. During this admission she was found to have new onset afib, they "cleaned up" her kidney, and she was discharged with eliquis, furosemide, and dulcolax. After discharge on Tuesday her shortness of breath and instability continued to get worse. On Wednesday morning she felt constipated and took 3 colace tablets and prune juice and began having diarrhea with bright red blood. She had about 20 episodes of diarrhea in the last 3 days. She has hemorrhoids and has experienced episodes of diarrhea and bright red blood but has never required transfusion in the past. This morning her home health nurse convinced her to go to the hospital. When EMS arrived she had a syncopal episode while being tested for orthostatic hypotension. She does not remember this episode but believes that this may have happened before in the past. Since the time of discharge she has also had decreased appetite.    Currently she is experiencing dizziness which is worse when she sits up or stands, shortness of breath, and weakness. She denies fever, headache, blurred vision, headache, cough, chest pain, palpitations,abdominal pain, nausea, dysuria, or hematuria.   In the ED she was afebrile, in atrial fibrillation with HR 90-100s, low normal BP, SpO2 98% on room air. She had Crt 4.68, GFR 8, WBC 17.2, Hgb 10.8. She was transfused 1 unit of PRBCs and admitted for lower GI bleed.   Meds:  Prescriptions Prior to Admission  Medication Sig Dispense Refill Last Dose  . acetaminophen (TYLENOL) 650 MG CR tablet Take 650 mg by mouth 2 (two) times daily.   12/09/2015 at Unknown time  . albuterol (VENTOLIN HFA) 108 (90 BASE) MCG/ACT inhaler Inhale 2 puffs into the lungs every 6 (six) hours as needed for wheezing.    12/09/2015 at Unknown time  . ALPRAZolam (XANAX) 0.5 MG tablet Take 0.5 mg by mouth at bedtime.    12/09/2015 at Unknown time  . apixaban (ELIQUIS) 2.5 MG TABS tablet Take 2.5 mg by mouth daily.   12/09/2015 at 1800  . aspirin 81 MG tablet Take 81 mg by mouth daily.     12/09/2015 at Unknown time  . BYSTOLIC 20 MG TABS Take 20 mg by mouth daily.   12/09/2015 at Unknown time  . chlorthalidone (HYGROTON) 50 MG tablet Take 50 mg by mouth daily.   12/09/2015 at Unknown time  .  Cyanocobalamin (VITAMIN B-12) 2500 MCG SUBL Place under the tongue daily.   12/09/2015 at Unknown time  . cycloSPORINE (RESTASIS) 0.05 % ophthalmic emulsion Place 1 drop into both eyes 2 (two) times daily.    12/09/2015 at Unknown time  . diltiazem (CARDIZEM CD) 240 MG 24 hr capsule Take 240 mg by mouth daily.   12/09/2015 at Unknown time  . DULoxetine (CYMBALTA) 60 MG capsule Take 60 mg by mouth daily.     12/09/2015 at Unknown time  . EDARBI 80 MG TABS Take 80 mg by mouth daily.   12/09/2015 at Unknown time  . EPINEPHrine 0.3 mg/0.3 mL IJ SOAJ injection Inject 0.3 mg into the muscle once as needed. Allergic reaction   unk at unk  .  fexofenadine (ALLEGRA) 180 MG tablet Take 180 mg by mouth daily.   12/09/2015 at Unknown time  . fluticasone (FLONASE) 50 MCG/ACT nasal spray 2 sprays by Nasal route daily.     12/09/2015 at Unknown time  . Fluticasone Furoate-Vilanterol (BREO ELLIPTA) 200-25 MCG/INH AEPB Inhale 1 puff into the lungs daily. 60 each 6 12/09/2015 at Unknown time  . furosemide (LASIX) 20 MG tablet Take 1 tablet (20 mg total) by mouth daily as needed for edema (ankle edema). (Patient taking differently: Take 20 mg by mouth daily. ) 60 tablet 11 12/09/2015 at Unknown time  . labetalol (NORMODYNE) 300 MG tablet Take 150 mg by mouth 2 (two) times daily.    unk at [employer]  . Multiple Vitamin (MULTIVITAMIN) tablet Take 1 tablet by mouth daily.     12/09/2015 at Unknown time  . omeprazole (PRILOSEC) 40 MG capsule Take 40 mg by mouth daily.   12/09/2015 at Unknown time  . predniSONE (DELTASONE) 5 MG tablet HOLD while on 10mg  prednisone then resume when 10mg  prednisone completed and then Take 9 mg everyday by mouth. (Patient taking differently: Take 10-15 mg by mouth daily. )   12/09/2015 at Unknown time  . rosuvastatin (CRESTOR) 5 MG tablet Take 5 mg by mouth daily.    12/09/2015 at Unknown time  . tiotropium (SPIRIVA) 18 MCG inhalation capsule Place 18 mcg into inhaler and inhale daily.   12/09/2015 at Unknown time  . traMADol (ULTRAM) 50 MG tablet Take 50 mg by mouth every 6 (six) hours as needed for moderate pain.    Past Week at Unknown time     Allergies: Allergies as of 12/10/2015 - Review Complete 12/10/2015  Allergen Reaction Noted  . Bee venom  07/07/2014  . Carbocaine [mepivacaine hcl]  07/07/2014  . Mepivacaine Nausea And Vomiting 10/16/2014  . Shellfish-derived products  06/02/2010  . Sulfa antibiotics  06/02/2010  . Zetia [ezetimibe]  06/02/2010   Past Medical History:  Diagnosis Date  . Asthma   . Atrial tachycardia (HCC)   . Bisphosphonate-associated osteonecrosis of the jaw (HCC)   . CAD (coronary  artery disease)    mild non obstructive per cardiac cath in 02/2010  . Chronic renal failure   . COPD (chronic obstructive pulmonary disease) (HCC)   . Dyspnea   . GERD (gastroesophageal reflux disease)   . Gout   . Hemorrhoids   . Hip pain   . HLD (hyperlipidemia)   . HTN (hypertension)   . Kidney failure   . Osteoarthritis   . Sleep apnea   . Tobacco abuse     Family History:  Family History  Problem Relation Age of Onset  . Rheum arthritis Father      Social History:  Social History   Social History  . Marital status: Divorced    Spouse name: N/A  . Number of children: N/A  . Years of education: N/A   Occupational History  . Not on file.   Social History Main Topics  . Smoking status: Current Every Day Smoker    Packs/day: 1.00    Types: Cigarettes    Start date: 02/14/1955  . Smokeless tobacco: Never Used  . Alcohol use No  . Drug use: No  . Sexual activity: Not on file   Other Topics Concern  . Not on file   Social History Narrative   Lives alone.   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    Review of Systems: A complete ROS was negative except as per HPI.   Physical Exam: Vitals:   12/10/15 1800 12/10/15 1815 12/10/15 1830 12/10/15 1845  BP: 121/78 109/75 124/83 119/92  Pulse: 105 95    Resp: 19 21 19 23   Temp: 98.4 F (36.9 C)     TempSrc: Oral     SpO2: 98% 98%     Physical Exam  Constitutional: She appears well-developed. No distress.  HENT:  Head: Normocephalic and atraumatic.  Lips are bright red, conjunctiva are not pale  Eyes: EOM are normal.  Cardiovascular: An irregularly irregular rhythm present.  Pulmonary/Chest: Effort normal. No respiratory distress. She has no wheezes. She has no rales.  Abdominal: Soft. She exhibits no distension. There is no tenderness.  No visible pulsation   Musculoskeletal: She exhibits edema.  1+ bilateral lower extremity pitting edema   Neurological: She is alert.  Skin: Skin is warm  and dry.  Large bruises over her arms and legs.  Right knee TKR scar    Psychiatric: She has a normal mood and affect. Her behavior is normal.     Labs: CBC:  Recent Labs Lab 12/10/15 1326  WBC 17.2*  NEUTROABS 15.5*  HGB 10.8*  HCT 32.4*  MCV 88.5  PLT 310    Basic Metabolic Panel:  Recent Labs Lab 12/10/15 1326  NA 132*  K 3.7  CL 95*  CO2 24  GLUCOSE 110*  BUN 87*  CREATININE 4.68*  CALCIUM 9.7    Cardiac Enzymes:  Recent Labs Lab 12/10/15 1326  TROPONINI 0.08*    BNP: Invalid input(s): POCBNP BNP (last 3 results) No results for input(s): BNP in the last 8760 hours.  ProBNP (last 3 results) No results for input(s): PROBNP in the last 8760 hours.   Coagulation Studies: No results for input(s): LABPROT, INR in the last 72 hours.  Liver Function Tests:  Recent Labs Lab 12/10/15 1326  AST 30  ALT 38  ALKPHOS 57  BILITOT 0.6  PROT 5.6*  ALBUMIN 2.7*   No results for input(s): LIPASE, AMYLASE in the last 168 hours. No results for input(s): AMMONIA in the last 168 hours.  Inflammatory Markers: No results for input(s): CRP, ESRSEDRATE in the last 72 hours.  CBG: No results found for: HGBA1C No results for input(s): GLUCAP in the last 168 hours.  Microbiology: No results found for this or any previous visit.  Urine Studies: Urinalysis No results found for: COLORURINE, APPEARANCEUR, LABSPEC, PHURINE, GLUCOSEU, HGBUR, BILIRUBINUR, KETONESUR, PROTEINUR, UROBILINOGEN, NITRITE, LEUKOCYTESUR   Drugs of Abuse  No results found for: LABOPIA, COCAINSCRNUR, LABBENZ, AMPHETMU, THCU, LABBARB    Imaging: No results found.   Assessment & Plan by Problem: Active Problems:   GI bleed Ms. RAYNETTE ARRAS is a 80  y.o. female with PMH CKD, ruptured abdominal aortic aneurism (10/2014), ventral hernia, new onset atrial fibrillation, external hemorrhoids, arthritis, iron deficiency anemia, obstructive sleep apnea. Presented today with bright red blood  per rectum and weaknes. Her symptoms are likely related to lower GI bleed secondary to external hemorrhoids or diverticulosis. She had a colonoscopy earlier this year, 5 polyps were removed which turned out to be hemmorhoids. She received 1 unit of PRBC in the ED.  GI is following, we appreciate their recommendations  PT, OT evaluation and treatment  Follow up post transfusion H&H  Orthostatic vitals  Will need follow up colonoscopy at discharge   AKI on CKD Stage IV  Presumably this is AKI related to her volume loss. She got 1 L NS in the ED and 1 unit PRBCs, we will continue giving her NS 125 ml/hr. Home medication list includes Lasix 20 mg and chlorthalidone 50 mg daily at home, presumably this is for her CKD. We have held this medication given her volume loss related to GI bleed.  Follow up morning BMET   atrial fibrillation,new onset  This was diagnosed at her previous hospitalization last week and she was discharged on Eliquis 2.5 mg daily. She is also on labetalol 300 mg daily and diltiazem 240 mg daily at home we are holding these as she has a GI bleed and we don't want to drop her blood pressure and has HR controlled in the 100s.   COPD   Home medications include tiotropium 10 mcg inhaler and albuterol inhaler. We have continued this.   Arthritis  Home medications include prednisone 5 mg daily at home, tramadol 50 mg q 6 hrs PRN, and cymbalta 60 mg daily. We have continued her cymbalta 60 mg daily.   iron deficiency anemia She describes a history of anemia in the past, she was previously taking iron supplementation but was told that she didn't need to take these any more. Iron studies would be inaccurate given her blood loss but this should be followed up outpatient.   obstructive sleep apnea She uses CPAP at home with a nasal mask. We have ordered CPAP for this admission.   Insomnia  We have continued her home medication Xanax 0.5 mg at bedtime.    Hyperlipidemia  We have  continued her home medication rosuvastatin 5 mg daily.   GERD  Her home medication is omeprazole 40 mg daily. We have ordered IV protonix BID.   Allergies  We have continued her home flonase nasal spray.   F Received 1 L bolus in the ED, giving 125 NS ml/hr  E none  N clear DVT Ppx SCDs  Code Status FULL   Dispo: Admit patient to Inpatient with expected length of stay greater than 2 midnights.  Signed: Eulah Pont, MD 12/10/2015, 7:19 PM  Pager: [phone]

## 2015-12-10 NOTE — ED Notes (Signed)
Report attempted 

## 2015-12-10 NOTE — Consult Note (Addendum)
[facility] Consult: 3:07 PM 12/10/2015  LOS: 0 days    Referring Provider: Dr Clayborne Dana Primary Care Physician:  Blane Ohara, MD in [place].  Primary Gastroenterologist: Sharrell Ku.  Unable to reach.  Phone msg says he is not available.     Reason for Consultation:  Hematochezia.    HPI: [name] is a 80 y.o. female.  *PMH asthma, COPD, OSA. CK D stage IV. History of ruptured AAA.  S/P open repair 10/17/2014 at [facility].  Note she had postop anemia with hemoglobin initially 10, near date of discharge hemoglobin 8.5 on 10/23/14 Arthritis, managed with prednisone.  Lower extremity cellulitis, recent antibiotics have included Keflex as well as Levaquin for management  Takes PPI regularly for GERD and HH.  Reviewing CT chest from 09/2005 for investigation of mediastinal shadow seen on an x-ray shows this shadow corresponds with a moderate sized hiatal hernia and small component of paraesophageal type hernia.. 10/2014 colonoscopy for what sounds like a positive Cologuard test.  Dr. Kinnie Scales removed several polyps.  He also noted her external hemorrhoid, which is chronic.  Do not have access to his report or to the pathology report. Patient was told she would need surveillance colonoscopy in 2019. Patient has history of periodic hemorrhoidal bleeding which she treats when necessary with Anusol HC suppositories and Proctofoam.  Patient discharged from hospital in [place] ~ 10/24.  Treated for atrial fibrillation and discharged on Eliquis.  During the hospitalization she was constipated, this is not her normal. She took several Colace on 1025 and had onset of soft stools and bright red blood. The bloody stools were continuous for the next 48 hours. Today her home health nurse convinced her to go to the  hospital, when EMS arrived the patient will had a syncopal spell while being tested for orthostatic hypotension.  In ED she was hypotensive.  Hemoglobin 10.8. MCV normal. Platelets normal. White blood cell count is elevated at 17.2. She has acute kidney injury (87/4.6 now, was 44/2.0 in 10/2014) with GFR of 8.  There are no PT/INR tests in Epic for this visit.  Initial troponin slightly elevated at 0.08.  Patient has had poor appetite for at least a week but no nausea or vomiting.  Although she hasn't been eating a lot, she has been drinking plenty of fluids and is urinating well. No abdominal pain. The blood she has seen in the last few days is a lot more than she normally has from her hemorrhoids.  She has occasional mild dysphagia in the region of the upper esophageal sphincter. No problems with food impactions or regurgitation of swallowed contents. Patient takes 81 mg aspirin every day but no NSAIDs. She doesn't drink all call and has no history of liver disease.    Past Medical History:  Diagnosis Date  . Asthma   . Atrial tachycardia (HCC)   . Bisphosphonate-associated osteonecrosis of the jaw (HCC)   . CAD (coronary artery disease)    mild non obstructive per cardiac cath in 02/2010  . Chronic renal failure   .  COPD (chronic obstructive pulmonary disease) (HCC)   . Dyspnea   . GERD (gastroesophageal reflux disease)   . Gout   . Hemorrhoids   . Hip pain   . HLD (hyperlipidemia)   . HTN (hypertension)   . Kidney failure   . Osteoarthritis   . Sleep apnea   . Tobacco abuse     Past Surgical History:  Procedure Laterality Date  . BREAST BIOPSY Right    x2; normal  . CARDIAC CATHETERIZATION  02/2010   LCX: 20% mid, RCA: 30-40% diffuse mid. EF :65%  . KNEE ARTHROSCOPY Bilateral   . REPLACEMENT TOTAL KNEE Left   . REPLACEMENT UNICONDYLAR JOINT KNEE Bilateral     Prior to Admission medications   Medication Sig Start Date End Date Taking? Authorizing Provider  albuterol  (VENTOLIN HFA) 108 (90 BASE) MCG/ACT inhaler Inhale 2 puffs into the lungs every 6 (six) hours as needed.      Historical Provider, MD  ALPRAZolam Prudy Feeler) 0.5 MG tablet Take 0.5 mg by mouth 2 (two) times daily.    Historical Provider, MD  aspirin 81 MG tablet Take 81 mg by mouth daily.      Historical Provider, MD  Calcium Carbonate-Vitamin D (CALCIUM + D) 600-200 MG-UNIT TABS Take by mouth daily.      Historical Provider, MD  Cyanocobalamin (VITAMIN B-12) 2500 MCG SUBL Place under the tongue daily.    Historical Provider, MD  cycloSPORINE (RESTASIS) 0.05 % ophthalmic emulsion 1 drop 2 (two) times daily.      Historical Provider, MD  DULoxetine (CYMBALTA) 60 MG capsule Take 60 mg by mouth daily.      Historical Provider, MD  EDARBYCLOR 40-25 MG TABS Take 1 tablet by mouth daily.    Historical Provider, MD  fexofenadine (ALLEGRA) 180 MG tablet Take 180 mg by mouth daily.    Historical Provider, MD  fluticasone (FLONASE) 50 MCG/ACT nasal spray 2 sprays by Nasal route daily.      Historical Provider, MD  Fluticasone Furoate-Vilanterol (BREO ELLIPTA) 200-25 MCG/INH AEPB Inhale 1 puff into the lungs daily. 07/07/14   Storm Frisk, MD  furosemide (LASIX) 20 MG tablet Take 1 tablet (20 mg total) by mouth daily as needed for edema (ankle edema). 09/08/14   Storm Frisk, MD  labetalol (NORMODYNE) 300 MG tablet Take 150 mg by mouth 2 (two) times daily.     Historical Provider, MD  Multiple Vitamin (MULTIVITAMIN) tablet Take 1 tablet by mouth daily.      Historical Provider, MD  omeprazole (PRILOSEC) 40 MG capsule Take 40 mg by mouth daily.    Historical Provider, MD  predniSONE (DELTASONE) 1 MG tablet Take 3 mg by mouth daily. In addition to 5mg  tablet for a total of 8mg  daily    Historical Provider, MD  predniSONE (DELTASONE) 5 MG tablet HOLD while on 10mg  prednisone then resume when 10mg  prednisone completed and then Take 9 mg everyday by mouth. Patient taking differently: Take 5 mg by mouth daily.  Takes in addition to 3 - 1 mg prednisone tablets for total of 8mg  09/08/14   Storm Frisk, MD  rosuvastatin (CRESTOR) 5 MG tablet Take 5 mg by mouth daily.     Historical Provider, MD  tiotropium (SPIRIVA) 18 MCG inhalation capsule Place 18 mcg into inhaler and inhale daily.    Historical Provider, MD  traMADol (ULTRAM) 50 MG tablet as needed.    Historical Provider, MD    Scheduled Meds:   Infusions: .  sodium chloride    . sodium chloride     PRN Meds:    Allergies as of 12/10/2015 - Review Complete 12/10/2015  Allergen Reaction Noted  . Bee venom  07/07/2014  . Carbocaine [mepivacaine hcl]  07/07/2014  . Shellfish-derived products  06/02/2010  . Sulfa antibiotics  06/02/2010  . Zetia [ezetimibe]  06/02/2010    Family History  Problem Relation Age of Onset  . Rheum arthritis Father     Social History   Social History  . Marital status: Divorced    Spouse name: N/A  . Number of children: N/A  . Years of education: N/A   Occupational History  . Not on file.   Social History Main Topics  . Smoking status: Current Every Day Smoker    Packs/day: 1.00    Types: Cigarettes    Start date: 02/14/1955  . Smokeless tobacco: Never Used  . Alcohol use No  . Drug use: No  . Sexual activity: Not on file   Other Topics Concern  . Not on file   Social History Narrative   Lives alone.   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    REVIEW OF SYSTEMS: Constitutional:  Fatigue and weakness.NT:  No nose bleeds Pulm:  Chronic cough. No hemoptysis. Recent worsening of chronic dyspnea.CV:  No palpitations, no LE edema.  GU:  No hematuria, no frequency GI:  Per HPI Heme:  Per HPI   Transfusions:  None recently Neuro:  No headaches, no peripheral tingling or numbness Derm:  No itching, no rash or sores. Has very thin skin from chronic steroids.  Endocrine:  No sweats or chills.  No polyuria or dysuria Immunization:  Not inquired  Travel:  None beyond local  counties in last few months.    PHYSICAL EXAM: Vital signs in last 24 hours: There were no vitals filed for this visit. Wt Readings from Last 3 Encounters:  10/06/14 74.4 kg (164 lb)  09/08/14 76 kg (167 lb 9.6 oz)  07/07/14 74.1 kg (163 lb 6.4 oz)    General: Chronically ill appearing somewhat cushingoid.  Pale Head:   No signs of head trauma. Eyes:  No scleral icterus. Conjunctiva pale. Ears:  No significant hearing loss.  Nose:  No discharge or congestion. Mouth:  Fair to good dental repair. Mucous membranes are moist and clear. Tongue is midline. Neck:  No thyromegaly. No adenopathy. No JVD. Lungs:  Upper airway wheezing. No crackles or rhonchi. No labored breathing. Voice is somewhat raspy. Heart: Irregularly irregular with controlled rate. Abdomen:  Mildly obese. Not tender or distended. Bowel sounds active. No hepatosplenomegaly.   Rectal: Large external hemorrhoid. This is not obviously bleeding or friable. Digital exam notable for scant amount of brown stool as well as specks of blood. No palpable masses. The rectum itself is somewhat tender.   Musc/Skeltl: No joint swelling, erythema or gross deformity. Extremities:  No CCE. Lots of bruises and general erythema in both lower legs, this is more prominent on the left shin.  Neurologic:  Alert. Oriented 3. Good historian. No limb weakness or tremor. Skin:  Significant load of large bruises in both the forearms and shins.  Skin on lower legs and forearms very thin c/w chronic steroids.  Tattoos:  None   Psych:  Cooperative, pleasant, calm.  Intake/Output from previous day: No intake/output data recorded. Intake/Output this shift: No intake/output data recorded.  LAB RESULTS:  Recent Labs  12/10/15 1326  WBC 17.2*  HGB 10.8*  HCT 32.4*  PLT 310   BMET Lab Results  Component Value Date   NA 132 (L) 12/10/2015   K 3.7 12/10/2015   CL 95 (L) 12/10/2015   CO2 24 12/10/2015   GLUCOSE 110 (H) 12/10/2015   BUN 87  (H) 12/10/2015   CREATININE 4.68 (H) 12/10/2015   CALCIUM 9.7 12/10/2015   LFT  Recent Labs  12/10/15 1326  PROT 5.6*  ALBUMIN 2.7*  AST 30  ALT 38  ALKPHOS 57  BILITOT 0.6   PT/INR No results found for: INR, PROTIME Hepatitis Panel No results for input(s): HEPBSAG, HCVAB, HEPAIGM, HEPBIGM in the last 72 hours. C-Diff No components found for: CDIFF Lipase  No results found for: LIPASE  Drugs of Abuse  No results found for: LABOPIA, COCAINSCRNUR, LABBENZ, AMPHETMU, THCU, LABBARB   RADIOLOGY STUDIES: No results found.    IMPRESSION:   *  Painless hematochezia. History of what sounds like adenomatous colon polyps in 10/2014.  Large non-bleeding hemorrhoid on rectal exam.   ? Diverticular vs hemorrhoidal bleeding.   *  Started within the last week on [facility] for atrial fibrillation.  *  10/2014 open repair of ruptured AAA.  *  Normocytic anemia. The most recent baseline we have is from the postoperative period following AAA when her hemoglobin was 8.5.  *  Asthma, COPD, OSA.  *  CKD. Labeled as stage IV in the [facility] notes from 10/2014. Now with AKI.  *  Known hiatal hernia. Described as having paraesophageal component per CT of 2007.    PLAN:     *  Hold Eliquis.  Serial CBC.  Transfuse Prn.  The ED doctor has already ordered PRBCs x 2 to transfuse. Dr. Myrtie Neither to determine extent of endoscopic evaluation  *  Patient can have full liquids.  Does not need PPI drip.  I ordered once daily oral Protonix.  Since the hemorrhoids are not currently bleeding, did not add Anusol.    Jennye Moccasin  12/10/2015, 3:07 PM Pager: [phone]  I have reviewed the entire case in detail with the above APP and discussed the plan in detail.  Therefore, I agree with the diagnoses recorded above. In addition,  I have personally interviewed and examined the patient and have personally reviewed any abdominal/pelvic CT scan images.  My additional thoughts are as follows:  Difficult  to tell if hemorrhoidal or diverticular.  She describes chronic painless bleeding that sounds hemorrhoidal, and may just have escalated in last few days on OAC.  Brown stool with fresh blood on exam favors HR bleeding. She seems disinclined to have a colonoscopy. Although she reportedly had a graft repair of a ruptured AAA, this bleeding does not seem like the sentinel event of an AE fistula bleed. She will be admitted for monitoring and transfusion.  We need the Eliquis to get out of her system, then will decide over weekend about colonoscopy vs sigmoidoscopy.    Charlie Pitter III Pager [phone]  Mon-Fri 8a-5p [phone] after 5p, weekends, holidays

## 2015-12-10 NOTE — ED Triage Notes (Signed)
Home health reported that patient lost consciousness when performing orthostatics this AM. Pt. Had bright red stools since last night. S3 kidney failure, COPD, A-fib. AAA in 2016

## 2015-12-10 NOTE — Progress Notes (Signed)
Pt has refused CPAP for the night stating that she can only use the nasal pillows.  RT to monitor and assess as needed.

## 2015-12-10 NOTE — ED Notes (Signed)
Critical lab called to this RN by Thayer Ohmhris, lab.   Trop 0.08.  Advised Dr. Clayborne DanaMesner of same.

## 2015-12-10 NOTE — ED Notes (Signed)
IV start unsuccessful in L hand.

## 2015-12-10 NOTE — ED Notes (Signed)
Report called  

## 2015-12-10 NOTE — ED Provider Notes (Signed)
[facility]-EMERGENCY DEPT Provider Note   CSN: [id number] Arrival date & time: 12/10/15  1310     History   Chief Complaint Chief Complaint  Patient presents with  . Loss of Consciousness    HPI [name] is a 80 y.o. female.  Arrived by EMS, who was called by home health aid, after a syncopal episode after 2 days of brbpr and orthostasis. Tachy/soft pressures with them.  Recently started on eliquis. No h/o GI bleed, last conlonoscopy was a year ago where non-cancerous polyps were removed. No abdominal pain, nausea, vomiting or other associated symptoms. No modifying factors.    The history is provided by the EMS personnel and the patient.  Loss of Consciousness   This is a new problem. She lost consciousness for a period of 1 to 5 minutes. The problem is associated with standing up. Associated symptoms include abdominal pain (intermittent), diaphoresis, dizziness, malaise/fatigue and weakness. Pertinent negatives include back pain, fever, headaches and visual change. She has tried nothing for the symptoms.    Past Medical History:  Diagnosis Date  . Asthma   . Atrial tachycardia (HCC)   . Bisphosphonate-associated osteonecrosis of the jaw (HCC)   . CAD (coronary artery disease)    mild non obstructive per cardiac cath in 02/2010  . Chronic renal failure   . COPD (chronic obstructive pulmonary disease) (HCC)   . Dyspnea   . GERD (gastroesophageal reflux disease)   . Gout   . Hemorrhoids   . Hip pain   . HLD (hyperlipidemia)   . HTN (hypertension)   . Kidney failure   . Osteoarthritis   . Sleep apnea   . Tobacco abuse     Patient Active Problem List   Diagnosis Date Noted  . GI bleed 12/10/2015  . COPD with chronic bronchitis Gold C Copd 07/08/2014  . CAD (coronary artery disease)   . Atrial tachycardia (HCC)   . HLD (hyperlipidemia)   . HTN (hypertension)   . Tobacco abuse     Past Surgical History:  Procedure Laterality Date  . BREAST BIOPSY Right    x2; normal  . CARDIAC CATHETERIZATION  02/2010   LCX: 20% mid, RCA: 30-40% diffuse mid. EF :65%  . KNEE ARTHROSCOPY Bilateral   . REPLACEMENT TOTAL KNEE Left   . REPLACEMENT UNICONDYLAR JOINT KNEE Bilateral     OB History    No data available       Home Medications    Prior to Admission medications   Medication Sig Start Date End Date Taking? Authorizing Provider  acetaminophen (TYLENOL) 650 MG CR tablet Take 650 mg by mouth 2 (two) times daily.   Yes Historical Provider, MD  albuterol (VENTOLIN HFA) 108 (90 BASE) MCG/ACT inhaler Inhale 2 puffs into the lungs every 6 (six) hours as needed for wheezing.    Yes Historical Provider, MD  ALPRAZolam Prudy Feeler) 0.5 MG tablet Take 0.5 mg by mouth at bedtime.    Yes Historical Provider, MD  apixaban (ELIQUIS) 2.5 MG TABS tablet Take 2.5 mg by mouth daily. 12/08/15  Yes Historical Provider, MD  aspirin 81 MG tablet Take 81 mg by mouth daily.     Yes Historical Provider, MD  BYSTOLIC 20 MG TABS Take 20 mg by mouth daily. 11/22/15  Yes Historical Provider, MD  chlorthalidone (HYGROTON) 50 MG tablet Take 50 mg by mouth daily. 11/22/15  Yes Historical Provider, MD  Cyanocobalamin (VITAMIN B-12) 2500 MCG SUBL Place under the tongue daily.   Yes Historical  Provider, MD  cycloSPORINE (RESTASIS) 0.05 % ophthalmic emulsion Place 1 drop into both eyes 2 (two) times daily.    Yes Historical Provider, MD  diltiazem (CARDIZEM CD) 240 MG 24 hr capsule Take 240 mg by mouth daily. 12/06/15  Yes Historical Provider, MD  DULoxetine (CYMBALTA) 60 MG capsule Take 60 mg by mouth daily.     Yes Historical Provider, MD  EDARBI 80 MG TABS Take 80 mg by mouth daily. 11/22/15  Yes Historical Provider, MD  EPINEPHrine 0.3 mg/0.3 mL IJ SOAJ injection Inject 0.3 mg into the muscle once as needed. Allergic reaction   Yes Historical Provider, MD  fexofenadine (ALLEGRA) 180 MG tablet Take 180 mg by mouth daily.   Yes Historical Provider, MD  fluticasone (FLONASE) 50 MCG/ACT nasal  spray 2 sprays by Nasal route daily.     Yes Historical Provider, MD  Fluticasone Furoate-Vilanterol (BREO ELLIPTA) 200-25 MCG/INH AEPB Inhale 1 puff into the lungs daily. 07/07/14  Yes Storm Frisk, MD  furosemide (LASIX) 20 MG tablet Take 1 tablet (20 mg total) by mouth daily as needed for edema (ankle edema). Patient taking differently: Take 20 mg by mouth daily.  09/08/14  Yes Storm Frisk, MD  labetalol (NORMODYNE) 300 MG tablet Take 150 mg by mouth 2 (two) times daily.    Yes Historical Provider, MD  Multiple Vitamin (MULTIVITAMIN) tablet Take 1 tablet by mouth daily.     Yes Historical Provider, MD  omeprazole (PRILOSEC) 40 MG capsule Take 40 mg by mouth daily.   Yes Historical Provider, MD  predniSONE (DELTASONE) 5 MG tablet HOLD while on 10mg  prednisone then resume when 10mg  prednisone completed and then Take 9 mg everyday by mouth. Patient taking differently: Take 10-15 mg by mouth daily.  09/08/14  Yes Storm Frisk, MD  rosuvastatin (CRESTOR) 5 MG tablet Take 5 mg by mouth daily.    Yes Historical Provider, MD  tiotropium (SPIRIVA) 18 MCG inhalation capsule Place 18 mcg into inhaler and inhale daily.   Yes Historical Provider, MD  traMADol (ULTRAM) 50 MG tablet Take 50 mg by mouth every 6 (six) hours as needed for moderate pain.    Yes Historical Provider, MD    Family History Family History  Problem Relation Age of Onset  . Rheum arthritis Father     Social History Social History  Substance Use Topics  . Smoking status: Current Every Day Smoker    Packs/day: 1.00    Types: Cigarettes    Start date: 02/14/1955  . Smokeless tobacco: Never Used  . Alcohol use No     Allergies   Bee venom; Carbocaine [mepivacaine hcl]; Mepivacaine; Shellfish-derived products; Sulfa antibiotics; and Zetia [ezetimibe]   Review of Systems Review of Systems  Constitutional: Positive for diaphoresis and malaise/fatigue. Negative for fever.  Cardiovascular: Positive for syncope.    Gastrointestinal: Positive for abdominal pain (intermittent).  Musculoskeletal: Negative for back pain.  Neurological: Positive for dizziness and weakness. Negative for headaches.  All other systems reviewed and are negative.    Physical Exam Updated Vital Signs BP 111/74   Pulse 87   Temp 98.1 F (36.7 C) (Oral)   Resp 26   SpO2 98%   Physical Exam  Constitutional: She appears well-developed and well-nourished.  HENT:  Head: Normocephalic and atraumatic.  Mouth/Throat: Mucous membranes are dry.  Eyes: Conjunctivae and EOM are normal. Pupils are equal, round, and reactive to light.  Neck: Normal range of motion.  Cardiovascular: Normal rate and regular rhythm.  Pulmonary/Chest: No stridor. No respiratory distress.  Abdominal: She exhibits no distension.  Genitourinary: Rectal exam shows guaiac positive stool.  Neurological: She is alert.  Nursing note and vitals reviewed.    ED Treatments / Results  Labs (all labs ordered are listed, but only abnormal results are displayed) Labs Reviewed  CBC WITH DIFFERENTIAL/PLATELET - Abnormal; Notable for the following:       Result Value   WBC 17.2 (*)    RBC 3.66 (*)    Hemoglobin 10.8 (*)    HCT 32.4 (*)    Neutro Abs 15.5 (*)    All other components within normal limits  COMPREHENSIVE METABOLIC PANEL - Abnormal; Notable for the following:    Sodium 132 (*)    Chloride 95 (*)    Glucose, Bld 110 (*)    BUN 87 (*)    Creatinine, Ser 4.68 (*)    Total Protein 5.6 (*)    Albumin 2.7 (*)    GFR calc non Af Amer 8 (*)    GFR calc Af Amer 9 (*)    All other components within normal limits  TROPONIN I - Abnormal; Notable for the following:    Troponin I 0.08 (*)    All other components within normal limits  URINALYSIS, ROUTINE W REFLEX MICROSCOPIC (NOT AT [facility])  CBC  POCT CBG (FASTING - GLUCOSE)-MANUAL ENTRY  TYPE AND SCREEN  PREPARE RBC (CROSSMATCH)  ABO/RH    EKG  EKG Interpretation None        Radiology No results found.  Procedures Procedures (including critical care time)  CRITICAL CARE Performed by: Marily Memos Total critical care time: 35 minutes Critical care time was exclusive of separately billable procedures and treating other patients. Critical care was necessary to treat or prevent imminent or life-threatening deterioration. Critical care was time spent personally by me on the following activities: development of treatment plan with patient and/or surrogate as well as nursing, discussions with consultants, evaluation of patient's response to treatment, examination of patient, obtaining history from patient or surrogate, ordering and performing treatments and interventions, ordering and review of laboratory studies, ordering and review of radiographic studies, pulse oximetry and re-evaluation of patient's condition.   Medications Ordered in ED Medications  sodium chloride 0.9 % bolus 1,000 mL (0 mLs Intravenous Stopped 12/10/15 1440)    And  0.9 %  sodium chloride infusion (not administered)  pantoprazole (PROTONIX) EC tablet 40 mg (not administered)  0.9 %  sodium chloride infusion (not administered)  0.9 %  sodium chloride infusion ( Intravenous New Bag/Given 12/10/15 1551)     Initial Impression / Assessment and Plan / ED Course  I have reviewed the triage vital signs and the nursing notes.  Pertinent labs & imaging results that were available during my care of the patient were reviewed by me and considered in my medical decision making (see chart for details).  Clinical Course   Syncope, likely hypovolemia from Gi bleed on eliquis. Will eval for other causes as well. Type/screen done. Will likely need PCC if any anemia at all.   Will hold on [facility] after consult with pharmacy. Has had some soft bp's, grossly positive rectal exam so will transfuse blood and consult GI/medicine for admission.   Final Clinical Impressions(s) / ED Diagnoses   Final  diagnoses:  Syncope, unspecified syncope type  Gastrointestinal hemorrhage, unspecified gastrointestinal hemorrhage type  AKI (acute kidney injury) (HCC)    New Prescriptions New Prescriptions   No medications on file  Marily MemosJason Decie Verne, MD 12/10/15 1723

## 2015-12-11 DIAGNOSIS — E785 Hyperlipidemia, unspecified: Secondary | ICD-10-CM

## 2015-12-11 DIAGNOSIS — J309 Allergic rhinitis, unspecified: Secondary | ICD-10-CM

## 2015-12-11 DIAGNOSIS — M1990 Unspecified osteoarthritis, unspecified site: Secondary | ICD-10-CM

## 2015-12-11 DIAGNOSIS — Z8261 Family history of arthritis: Secondary | ICD-10-CM

## 2015-12-11 DIAGNOSIS — I4891 Unspecified atrial fibrillation: Secondary | ICD-10-CM

## 2015-12-11 DIAGNOSIS — Z882 Allergy status to sulfonamides status: Secondary | ICD-10-CM

## 2015-12-11 DIAGNOSIS — F17210 Nicotine dependence, cigarettes, uncomplicated: Secondary | ICD-10-CM

## 2015-12-11 DIAGNOSIS — N183 Chronic kidney disease, stage 3 (moderate): Secondary | ICD-10-CM

## 2015-12-11 DIAGNOSIS — D509 Iron deficiency anemia, unspecified: Secondary | ICD-10-CM

## 2015-12-11 DIAGNOSIS — N179 Acute kidney failure, unspecified: Secondary | ICD-10-CM | POA: Diagnosis present

## 2015-12-11 DIAGNOSIS — Z8679 Personal history of other diseases of the circulatory system: Secondary | ICD-10-CM

## 2015-12-11 DIAGNOSIS — Z91030 Bee allergy status: Secondary | ICD-10-CM

## 2015-12-11 DIAGNOSIS — Z79899 Other long term (current) drug therapy: Secondary | ICD-10-CM

## 2015-12-11 DIAGNOSIS — Z96651 Presence of right artificial knee joint: Secondary | ICD-10-CM

## 2015-12-11 DIAGNOSIS — Z888 Allergy status to other drugs, medicaments and biological substances status: Secondary | ICD-10-CM

## 2015-12-11 DIAGNOSIS — K5730 Diverticulosis of large intestine without perforation or abscess without bleeding: Secondary | ICD-10-CM

## 2015-12-11 DIAGNOSIS — R778 Other specified abnormalities of plasma proteins: Secondary | ICD-10-CM

## 2015-12-11 DIAGNOSIS — Z7952 Long term (current) use of systemic steroids: Secondary | ICD-10-CM

## 2015-12-11 DIAGNOSIS — E860 Dehydration: Secondary | ICD-10-CM

## 2015-12-11 DIAGNOSIS — G4733 Obstructive sleep apnea (adult) (pediatric): Secondary | ICD-10-CM

## 2015-12-11 DIAGNOSIS — J449 Chronic obstructive pulmonary disease, unspecified: Secondary | ICD-10-CM

## 2015-12-11 DIAGNOSIS — I129 Hypertensive chronic kidney disease with stage 1 through stage 4 chronic kidney disease, or unspecified chronic kidney disease: Secondary | ICD-10-CM

## 2015-12-11 DIAGNOSIS — G4700 Insomnia, unspecified: Secondary | ICD-10-CM

## 2015-12-11 LAB — BASIC METABOLIC PANEL
ANION GAP: 12 (ref 5–15)
BUN: 70 mg/dL — ABNORMAL HIGH (ref 6–20)
CALCIUM: 9.5 mg/dL (ref 8.9–10.3)
CHLORIDE: 102 mmol/L (ref 101–111)
CO2: 23 mmol/L (ref 22–32)
Creatinine, Ser: 3.52 mg/dL — ABNORMAL HIGH (ref 0.44–1.00)
GFR calc non Af Amer: 11 mL/min — ABNORMAL LOW (ref >60)
GFR, EST AFRICAN AMERICAN: 13 mL/min — AB (ref >60)
GLUCOSE: 107 mg/dL — AB (ref 65–99)
POTASSIUM: 2.9 mmol/L — AB (ref 3.5–5.1)
Sodium: 137 mmol/L (ref 135–145)

## 2015-12-11 LAB — TYPE AND SCREEN
ABO/RH(D): O POS
Antibody Screen: NEGATIVE
UNIT DIVISION: 0
Unit division: 0

## 2015-12-11 LAB — CBC
HEMATOCRIT: 38.4 % (ref 36.0–46.0)
HEMOGLOBIN: 12.8 g/dL (ref 12.0–15.0)
MCH: 29.1 pg (ref 26.0–34.0)
MCHC: 33.3 g/dL (ref 30.0–36.0)
MCV: 87.3 fL (ref 78.0–100.0)
Platelets: 262 10*3/uL (ref 150–400)
RBC: 4.4 MIL/uL (ref 3.87–5.11)
RDW: 14.5 % (ref 11.5–15.5)
WBC: 14.9 10*3/uL — AB (ref 4.0–10.5)

## 2015-12-11 MED ORDER — PANTOPRAZOLE SODIUM 40 MG PO TBEC: 40.0000 mg | DELAYED_RELEASE_TABLET | Freq: Every day | ORAL | Status: DC

## 2015-12-11 MED ORDER — DILTIAZEM HCL 30 MG PO TABS
30.0000 mg | ORAL_TABLET | Freq: Four times a day (QID) | ORAL | Status: DC
  Administered 2015-12-11 – 2015-12-12 (×4): 30 mg via ORAL
  Filled 2015-12-11 (×4): qty 1

## 2015-12-11 MED ORDER — POTASSIUM CHLORIDE CRYS ER 20 MEQ PO TBCR
40.0000 meq | EXTENDED_RELEASE_TABLET | Freq: Once | ORAL | Status: AC
  Administered 2015-12-11: 40 meq via ORAL
  Filled 2015-12-11: qty 2

## 2015-12-11 MED ORDER — PANTOPRAZOLE SODIUM 40 MG PO TBEC
40.0000 mg | DELAYED_RELEASE_TABLET | Freq: Every day | ORAL | Status: DC
  Administered 2015-12-11 – 2015-12-13 (×3): 40 mg via ORAL
  Filled 2015-12-11 (×3): qty 1

## 2015-12-11 NOTE — Evaluation (Signed)
Physical Therapy Evaluation Patient Details Name: [name] MRN: [id number] DOB: Mar 12, 1935   Today's Date: 12/11/2015   History of Present Illness  Ms. Victoria Planhyllis C Benecke is a 80 y.o. female with a PMH of CKD, ruptured abdominal aortic aneurism (10/2014), ventral hernia, new onset atrial fibrillation, external hemorrhoids, arthritis, iron deficiency anemia, obstructive sleep apnea, COPD, GERD who presents with diarrhea with bright red blood, shortness of breath, and weakness  Clinical Impression  Pt admitted with/for weakness, SOB and diarrhea with bright red blood..  Pt currently limited functionally due to the problems listed below.  (see problems list.)  Pt will benefit from PT to maximize function and safety to be able to get home safely with limited assist.     Follow Up Recommendations No PT follow up    Equipment Recommendations       Recommendations for Other Services       Precautions / Restrictions Precautions Precautions: Fall Restrictions Weight Bearing Restrictions: No      Mobility  Bed Mobility Overal bed mobility: Modified Independent                Transfers Overall transfer level: Needs assistance   Transfers: Sit to/from Stand;Stand Pivot Transfers Sit to Stand: Min assist Stand pivot transfers: Min assist;Min guard       General transfer comment: initially needed more steady assist.  Once moving min guard  Ambulation/Gait Ambulation/Gait assistance: Min guard Ambulation Distance (Feet): 25 Feet Assistive device: Rolling walker (2 wheeled) Gait Pattern/deviations: Step-through pattern     General Gait Details: limited by patient.  Generally steady with the RW.  HR during ambulation   (and stationary) was labile and tachy at 120's through upper 140's.  Sats upper 90's  Stairs            Wheelchair Mobility    Modified Rankin (Stroke Patients Only)       Balance Overall balance assessment: Needs assistance Sitting-balance  support: No upper extremity supported Sitting balance-Leahy Scale: Good       Standing balance-Leahy Scale: Fair                               Pertinent Vitals/Pain Pain Assessment: No/denies pain    Home Living Family/patient expects to be discharged to:: Private residence Living Arrangements: Alone Available Help at Discharge: Friend(s);Available PRN/intermittently Type of Home: House Home Access: Ramped entrance     Home Layout: One level Home Equipment: Walker - 4 wheels;Cane - single point      Prior Function Level of Independence: Independent;Independent with assistive device(s)         Comments: lately has not been able to mobilize well, but prior to this run of weakness/afib, was independent     Hand Dominance   Dominant Hand: Left    Extremity/Trunk Assessment   Upper Extremity Assessment: Defer to OT evaluation           Lower Extremity Assessment: Overall WFL for tasks assessed (general weakness proximal musculature and trunk)         Communication   Communication: No difficulties  Cognition Arousal/Alertness: Awake/alert Behavior During Therapy: WFL for tasks assessed/performed Overall Cognitive Status: Within Functional Limits for tasks assessed                      General Comments General comments (skin integrity, edema, etc.): see gait comments for vitals    Exercises  Assessment/Plan    PT Assessment Patient needs continued PT services  PT Problem List Decreased strength;Decreased balance;Decreased activity tolerance;Decreased mobility;Decreased knowledge of use of DME;Decreased knowledge of precautions;Cardiopulmonary status limiting activity          PT Treatment Interventions Gait training;Functional mobility training;Therapeutic activities;Patient/family education    PT Goals (Current goals can be found in the Care Plan section)  Acute Rehab PT Goals Patient Stated Goal: go home PT Goal  Formulation: With patient Time For Goal Achievement: 12/18/15 Potential to Achieve Goals: Good    Frequency Min 3X/week   Barriers to discharge        Co-evaluation               End of Session   Activity Tolerance: Patient tolerated treatment well;Other (comment) (limited by tachycardia) Patient left: in bed;with call bell/phone within reach;with bed alarm set Nurse Communication: Mobility status         Time: [id number] PT Time Calculation (min) (ACUTE ONLY): 22 min   Charges:   PT Evaluation $PT Eval Low Complexity: 1 Procedure     PT G Codes:        Fadia Marlar, Eliseo GumKenneth V 12/11/2015, 11:20 AM

## 2015-12-11 NOTE — Progress Notes (Signed)
Subjective: [name] is feels that her lightheadedness and weakness has improved significantly today. She has been able to sit up straight without feeling dizzy. She had one episode of bloody stool this morning. She does state that keeping track of all of her medications and when to take them has been difficult for her.   Objective:  Vital signs in last 24 hours: Vitals:   12/11/15 0356 12/11/15 0800 12/11/15 0911 12/11/15 1100  BP: (!) 137/94 128/86  (!) 126/91  Pulse: (!) 151 (!) 135  (!) 135  Resp: (!) 26 (!) 25  (!) 29  Temp: 97.5 F (36.4 C) 97.8 F (36.6 C)  97.9 F (36.6 C)  TempSrc: Oral Oral  Oral  SpO2: 95% 91% 91% 94%  Weight: 155 lb 12.8 oz (70.7 kg)     Height: 5\' 2"  (1.575 m)      Physical Exam  Cardiovascular: An irregularly irregular rhythm present. Tachycardia present.   No murmur heard. Abdominal: Soft. She exhibits no distension. There is no tenderness.  Extremities: no calf tenderness, 1+ bilateral pitting edema   Medications: Infusions: . sodium chloride 125 mL/hr (12/11/15 0542)   Scheduled Medications: . ALPRAZolam  0.5 mg Oral QHS  . cycloSPORINE  1 drop Both Eyes BID  . diltiazem  30 mg Oral Q6H  . DULoxetine  60 mg Oral Daily  . fluticasone  2 spray Each Nare Daily  . fluticasone furoate-vilanterol  1 puff Inhalation Daily  . pantoprazole  40 mg Oral Daily  . rosuvastatin  5 mg Oral q1800  . sodium chloride flush  3 mL Intravenous Q12H  . tiotropium  18 mcg Inhalation Daily   PRN Medications: albuterol, traMADol  Assessment/Plan:   GI bleed [name] is a 80 y.o. female with PMH CKD, ruptured abdominal aortic aneurism (10/2014), ventral hernia, new onset atrial fibrillation, external hemorrhoids, arthritis, iron deficiency anemia, obstructive sleep apnea. Presented 10/27 with bright red blood per rectum and weakness and was found to have Hgb 10. She received 1 unit of PRBC in the ED and Hgb improved to 12.8. She had  another episode of bloody diarrhea today, these bloody bowel movements are likely related to lower GI bleed diverticulosis vs hemorrhoids.  GI is following, they plan for sigmoidoscopy,  we appreciate their recommendations   atrial fibrillation,new onset  This was diagnosed at her previous hospitalization last week and she was discharged on Eliquis 2.5 mg daily. CHADVASC 4 but we are holding her anticoagulant given her present lower GI bleed.  She is also on diltiazem 240 mg daily at home for rate control. Today her rates are 120-130s, we have started Diltiazem 30 mg q6 hrs, this dose can be increased if her rate remains elevate.   AKI on CKD Stage IV  On admission she had Crt 3.7 this improved to 2.9 today after 1 L NS given in the Ed and NS 125 ml/hr overnight. Her baseline crt is around 2, this may be AKI related to her volume loss. Home medication list includes Lasix 20 mg and she was recently started on chlorthalidone 50 mg daily at [facility], presumably this is for her CKD. We have held the diuretics given her volume loss related to GI bleed.  Follow up morning BMET   Hypokalemia  She has a K 2.9 today, repleated with K-DUR 40 meq.  Recheck morning bmet   COPD  Home medications include lasix 20 mg, tiotropium  10 mcg inhaler and albuterol inhaler. We have continued theses.   Arthritis  Home medications include prednisone 5 mg daily at home, tramadol 50 mg q 6 hrs PRN, and cymbalta 60 mg daily. We have continued her cymbalta 60 mg daily.   obstructive sleep apnea She uses CPAP at home with a nasal mask. We had ordered CPAP on admission but we do not have the mask that she likes to use at home.   Insomnia  We have continued her home medication Xanax 0.5 mg at bedtime.    Hyperlipidemia  We have continued her home medication rosuvastatin 5 mg daily.   GERD  Her home medication is omeprazole 40 mg daily. We have ordered IV protonix BID.    Allergies  We have continued her home flonase nasal spray.   Will lay out her medication regiment in detail for her in detail at discharge.  Dispo: Anticipated discharge in approximately 2-3 day(s).   LOS: 1 day   Eulah PontNina Estel Tonelli, MD 12/11/2015, 3:21 PM Pager: [phone]

## 2015-12-11 NOTE — Progress Notes (Signed)
Daily Rounding Note  12/11/2015, 9:34 AM  LOS: 1 day   SUBJECTIVE:   Chief complaint: bloody stool.   Last night she had brown stool with small amount of blood, this AM there was brown stool and no blood.  She now gives a history of pain in left abdomen, thinks this started around time of bleeding.  This pain is better.  No nausea.  Likes the full liquid diet and turns down offer to advance to solid foods. Overall she feels better.      OBJECTIVE:         Vital signs in last 24 hours:    Temp:  [97.5 F (36.4 C)-98.7 F (37.1 C)] 97.8 F (36.6 C) (10/28 0800) Pulse Rate:  [42-151] 135 (10/28 0800) Resp:  [15-30] 25 (10/28 0800) BP: (75-137)/(55-94) 128/86 (10/28 0800) SpO2:  [56 %-98 %] 91 % (10/28 0911) Weight:  [70.7 kg (155 lb 12.8 oz)] 70.7 kg (155 lb 12.8 oz) (10/28 0356) Last BM Date: 12/10/15 Filed Weights   12/11/15 0356  Weight: 70.7 kg (155 lb 12.8 oz)   General: looks chronically ill.     Heart: Irreg, irreg, tachy into 130s just sitting on commode Chest: diminished BS on the left.  Hoarse vocal quality.   Abdomen: soft, NT, obese, ventral hernia  Extremities: no CCE.  Discoloration on LE Neuro/Psych:  Oriented x 3.  No tremor, moves all 4s.  No gross deficits.   Intake/Output from previous day: 10/27 0701 - 10/28 0700 In: 1625.5 [I.V.:290.5; Blood:335; IV Piggyback:1000] Out: -   Intake/Output this shift: Total I/O In: 360 [P.O.:360] Out: -   Lab Results:  Recent Labs  12/10/15 1326 12/10/15 2038 12/11/15 0502  WBC 17.2* 14.3* 14.9*  HGB 10.8* 11.3* 12.8  HCT 32.4* 34.6* 38.4  PLT 310 263 262   BMET  Recent Labs  12/10/15 1326 12/11/15 0502  NA 132* 137  K 3.7 2.9*  CL 95* 102  CO2 24 23  GLUCOSE 110* 107*  BUN 87* 70*  CREATININE 4.68* 3.52*  CALCIUM 9.7 9.5   LFT  Recent Labs  12/10/15 1326  PROT 5.6*  ALBUMIN 2.7*  AST 30  ALT 38  ALKPHOS 57  BILITOT 0.6    PT/INR No results for input(s): LABPROT, INR in the last 72 hours. Hepatitis Panel No results for input(s): HEPBSAG, HCVAB, HEPAIGM, HEPBIGM in the last 72 hours.  Studies/Results: No results found.   Scheduled Meds: . ALPRAZolam  0.5 mg Oral QHS  . cycloSPORINE  1 drop Both Eyes BID  . DULoxetine  60 mg Oral Daily  . fluticasone  2 spray Each Nare Daily  . fluticasone furoate-vilanterol  1 puff Inhalation Daily  . pantoprazole  40 mg Oral Q0600  . rosuvastatin  5 mg Oral q1800  . sodium chloride flush  3 mL Intravenous Q12H  . tiotropium  18 mcg Inhalation Daily   Continuous Infusions: . sodium chloride 125 mL/hr (12/11/15 0542)   PRN Meds:.albuterol, traMADol   ASSESMENT:   *  Hematochezia.  Marland Kitchen. History of what sounds like adenomatous colon polyps in 10/2014.  Large non-bleeding hemorrhoid on rectal exam.   ? Diverticular vs hemorrhoidal bleeding.  With her now saying she has had left abdominal pain, ischemic colitis added to DDx.    *  A fib, Started Eliquis ~ 1 week ago.  This is on hold.  Currently in rapid A fib.   *  Hypokalemia.  Treated with 40 KDur this AM.    *  Normocytic anemia. The most recent baseline we have is from the postoperative period following AAA when her hemoglobin was 8.5. S/p PRBC x 2.  Hgb improved.   *  Single elevated Troponin to 0.08 at 1330 10/27, has not been repeated.   *  Asthma, COPD, OSA.  *  AKI, improved.  Baseline CKD, stage 4 in 10/2014.   *  Known hiatal hernia. Described as having paraesophageal component per CT of 2007. On Protonix.   *  10/2014 open repair of ruptured AAA.   PLAN   *  Decision re: sigmoidoscopy vs colonoscopy per MD in conjunction with pt.   Will attempt to obtain 10/2014. colonoscopy report on Monday.  Continue holding Eliquis.    *   Switch to oral Protonix.  CBC in AM.  BMET in AM.       Jennye MoccasinSarah Gribbin  12/11/2015, 9:34 AM Pager: [phone]  I have discussed the case with the PA, and  that is the plan I formulated. I personally interviewed and examined the patient.  The stable hemoglobin after transfusion now seems to speaks somewhat against diverticular bleeding. She also was still passing brown stool with some blood, which speaks to perhaps hemorrhoidal bleeding. She does not want to undergo colonoscopy. I offered her sigmoidoscopy yesterday, which hopefully will be sufficient to localize the source of bleeding in an effort to then when it is safe to resume her oral anticoagulation for recent onset A. Fib. She is agreeable to a sigmoidoscopy tomorrow. Some left lower quadrant pain expressed overnight raises the suspicion for ischemic colitis, but that was not present upon admission.    Charlie PitterHenry L Danis III Pager [phone]  Mon-Fri 8a-5p [phone] after 5p, weekends, holidays

## 2015-12-11 NOTE — Progress Notes (Signed)
Pt. Refused cpap. 

## 2015-12-12 ENCOUNTER — Encounter (HOSPITAL_COMMUNITY): Payer: Self-pay | Admitting: *Deleted

## 2015-12-12 ENCOUNTER — Encounter (HOSPITAL_COMMUNITY)
Admission: EM | Disposition: A | Discharge status: Home Health | Payer: Self-pay | Source: Home / Self Care | Attending: Internal Medicine

## 2015-12-12 DIAGNOSIS — K625 Hemorrhage of anus and rectum: Secondary | ICD-10-CM

## 2015-12-12 HISTORY — PX: FLEXIBLE SIGMOIDOSCOPY: SHX5431

## 2015-12-12 LAB — CBC
HEMATOCRIT: 40 % (ref 36.0–46.0)
HEMOGLOBIN: 13.1 g/dL (ref 12.0–15.0)
MCH: 29.8 pg (ref 26.0–34.0)
MCHC: 32.8 g/dL (ref 30.0–36.0)
MCV: 91.1 fL (ref 78.0–100.0)
Platelets: 245 10*3/uL (ref 150–400)
RBC: 4.39 MIL/uL (ref 3.87–5.11)
RDW: 15.5 % (ref 11.5–15.5)
WBC: 15.2 10*3/uL — AB (ref 4.0–10.5)

## 2015-12-12 LAB — BASIC METABOLIC PANEL
ANION GAP: 9 (ref 5–15)
BUN: 50 mg/dL — ABNORMAL HIGH (ref 6–20)
CALCIUM: 9.3 mg/dL (ref 8.9–10.3)
CO2: 20 mmol/L — ABNORMAL LOW (ref 22–32)
CREATININE: 2.81 mg/dL — AB (ref 0.44–1.00)
Chloride: 110 mmol/L (ref 101–111)
GFR, EST AFRICAN AMERICAN: 17 mL/min — AB (ref >60)
GFR, EST NON AFRICAN AMERICAN: 15 mL/min — AB (ref >60)
Glucose, Bld: 102 mg/dL — ABNORMAL HIGH (ref 65–99)
Potassium: 3.5 mmol/L (ref 3.5–5.1)
SODIUM: 139 mmol/L (ref 135–145)

## 2015-12-12 SURGERY — SIGMOIDOSCOPY, FLEXIBLE
Anesthesia: Moderate Sedation

## 2015-12-12 MED ORDER — HYDROCORTISONE ACETATE 25 MG RE SUPP
25.0000 mg | Freq: Two times a day (BID) | RECTAL | Status: DC
  Administered 2015-12-12 – 2015-12-13 (×2): 25 mg via RECTAL
  Filled 2015-12-12 (×2): qty 1

## 2015-12-12 MED ORDER — SODIUM CHLORIDE 0.9 % IV SOLN
INTRAVENOUS | Status: DC
  Administered 2015-12-12: 20 mL/h via INTRAVENOUS

## 2015-12-12 MED ORDER — LABETALOL HCL 100 MG PO TABS
100.0000 mg | ORAL_TABLET | Freq: Two times a day (BID) | ORAL | Status: DC
  Administered 2015-12-12 (×2): 100 mg via ORAL
  Filled 2015-12-12 (×2): qty 1

## 2015-12-12 MED ORDER — FENTANYL CITRATE (PF) 100 MCG/2ML IJ SOLN
INTRAMUSCULAR | Status: AC
  Filled 2015-12-12: qty 2

## 2015-12-12 MED ORDER — APIXABAN 2.5 MG PO TABS
2.5000 mg | ORAL_TABLET | Freq: Two times a day (BID) | ORAL | Status: DC
  Administered 2015-12-13: 2.5 mg via ORAL
  Filled 2015-12-12: qty 1

## 2015-12-12 MED ORDER — MIDAZOLAM HCL 5 MG/ML IJ SOLN
INTRAMUSCULAR | Status: AC
  Filled 2015-12-12: qty 2

## 2015-12-12 MED ORDER — PHENYLEPHRINE IN HARD FAT 0.25 % RE SUPP
1.0000 | Freq: Two times a day (BID) | RECTAL | Status: DC
  Administered 2015-12-12 (×2): 1 via RECTAL
  Filled 2015-12-12 (×5): qty 1

## 2015-12-12 MED ORDER — MIDAZOLAM HCL 10 MG/2ML IJ SOLN
INTRAMUSCULAR | Status: DC | PRN
  Administered 2015-12-12: 2 mg via INTRAVENOUS
  Administered 2015-12-12: 1 mg via INTRAVENOUS

## 2015-12-12 MED ORDER — DILTIAZEM HCL 30 MG PO TABS
45.0000 mg | ORAL_TABLET | Freq: Four times a day (QID) | ORAL | Status: DC
  Administered 2015-12-12 – 2015-12-13 (×3): 45 mg via ORAL
  Filled 2015-12-12 (×3): qty 2

## 2015-12-12 MED ORDER — SPOT INK MARKER SYRINGE KIT
PACK | SUBMUCOSAL | Status: AC
  Filled 2015-12-12: qty 5

## 2015-12-12 NOTE — H&P (View-Only) (Signed)
Daily Rounding Note  12/11/2015, 9:34 AM  LOS: 1 day   SUBJECTIVE:   Chief complaint: bloody stool.   Last night she had brown stool with small amount of blood, this AM there was brown stool and no blood.  She now gives a history of pain in left abdomen, thinks this started around time of bleeding.  This pain is better.  No nausea.  Likes the full liquid diet and turns down offer to advance to solid foods. Overall she feels better.      OBJECTIVE:         Vital signs in last 24 hours:    Temp:  [97.5 F (36.4 C)-98.7 F (37.1 C)] 97.8 F (36.6 C) (10/28 0800) Pulse Rate:  [42-151] 135 (10/28 0800) Resp:  [15-30] 25 (10/28 0800) BP: (75-137)/(55-94) 128/86 (10/28 0800) SpO2:  [56 %-98 %] 91 % (10/28 0911) Weight:  [70.7 kg (155 lb 12.8 oz)] 70.7 kg (155 lb 12.8 oz) (10/28 0356) Last BM Date: 12/10/15 Filed Weights   12/11/15 0356  Weight: 70.7 kg (155 lb 12.8 oz)   General: looks chronically ill.     Heart: Irreg, irreg, tachy into 130s just sitting on commode Chest: diminished BS on the left.  Hoarse vocal quality.   Abdomen: soft, NT, obese, ventral hernia  Extremities: no CCE.  Discoloration on LE Neuro/Psych:  Oriented x 3.  No tremor, moves all 4s.  No gross deficits.   Intake/Output from previous day: 10/27 0701 - 10/28 0700 In: 1625.5 [I.V.:290.5; Blood:335; IV Piggyback:1000] Out: -   Intake/Output this shift: Total I/O In: 360 [P.O.:360] Out: -   Lab Results:  Recent Labs  12/10/15 1326 12/10/15 2038 12/11/15 0502  WBC 17.2* 14.3* 14.9*  HGB 10.8* 11.3* 12.8  HCT 32.4* 34.6* 38.4  PLT 310 263 262   BMET  Recent Labs  12/10/15 1326 12/11/15 0502  NA 132* 137  K 3.7 2.9*  CL 95* 102  CO2 24 23  GLUCOSE 110* 107*  BUN 87* 70*  CREATININE 4.68* 3.52*  CALCIUM 9.7 9.5   LFT  Recent Labs  12/10/15 1326  PROT 5.6*  ALBUMIN 2.7*  AST 30  ALT 38  ALKPHOS 57  BILITOT 0.6    PT/INR No results for input(s): LABPROT, INR in the last 72 hours. Hepatitis Panel No results for input(s): HEPBSAG, HCVAB, HEPAIGM, HEPBIGM in the last 72 hours.  Studies/Results: No results found.   Scheduled Meds: . ALPRAZolam  0.5 mg Oral QHS  . cycloSPORINE  1 drop Both Eyes BID  . DULoxetine  60 mg Oral Daily  . fluticasone  2 spray Each Nare Daily  . fluticasone furoate-vilanterol  1 puff Inhalation Daily  . pantoprazole  40 mg Oral Q0600  . rosuvastatin  5 mg Oral q1800  . sodium chloride flush  3 mL Intravenous Q12H  . tiotropium  18 mcg Inhalation Daily   Continuous Infusions: . sodium chloride 125 mL/hr (12/11/15 0542)   PRN Meds:.albuterol, traMADol   ASSESMENT:   *  Hematochezia.  Marland Kitchen. History of what sounds like adenomatous colon polyps in 10/2014.  Large non-bleeding hemorrhoid on rectal exam.   ? Diverticular vs hemorrhoidal bleeding.  With her now saying she has had left abdominal pain, ischemic colitis added to DDx.    *  A fib, Started Eliquis ~ 1 week ago.  This is on hold.  Currently in rapid A fib.   *  Hypokalemia.  Treated with 40 KDur this AM.    *  Normocytic anemia. The most recent baseline we have is from the postoperative period following AAA when her hemoglobin was 8.5. S/p PRBC x 2.  Hgb improved.   *  Single elevated Troponin to 0.08 at 1330 10/27, has not been repeated.   *  Asthma, COPD, OSA.  *  AKI, improved.  Baseline CKD, stage 4 in 10/2014.   *  Known hiatal hernia. Described as having paraesophageal component per CT of 2007. On Protonix.   *  10/2014 open repair of ruptured AAA.   PLAN   *  Decision re: sigmoidoscopy vs colonoscopy per MD in conjunction with pt.   Will attempt to obtain 10/2014. colonoscopy report on Monday.  Continue holding Eliquis.    *   Switch to oral Protonix.  CBC in AM.  BMET in AM.       Sarah Gribbin  12/11/2015, 9:34 AM Pager: [phone]  I have discussed the case with the PA, and  that is the plan I formulated. I personally interviewed and examined the patient.  The stable hemoglobin after transfusion now seems to speaks somewhat against diverticular bleeding. She also was still passing brown stool with some blood, which speaks to perhaps hemorrhoidal bleeding. She does not want to undergo colonoscopy. I offered her sigmoidoscopy yesterday, which hopefully will be sufficient to localize the source of bleeding in an effort to then when it is safe to resume her oral anticoagulation for recent onset A. Fib. She is agreeable to a sigmoidoscopy tomorrow. Some left lower quadrant pain expressed overnight raises the suspicion for ischemic colitis, but that was not present upon admission.    Kaid Seeberger L Danis III Pager [phone]  Mon-Fri 8a-5p [phone] after 5p, weekends, holidays  

## 2015-12-12 NOTE — Progress Notes (Signed)
   Subjective:  Patient feeling much better this morning. Eating breakfast after her procedure. Reports 10-12 bowel movements yesterday, all small. Some streaky blood initially but cleared later in the day. Only 2 bowel movements recorded. Tells me she has no way of going home today. Lives alone.  Objective:  Vital signs in last 24 hours: Vitals:   12/11/15 1100 12/11/15 1718 12/11/15 2100 12/12/15 0428  BP: (!) 126/91 (!) 116/105 (!) 139/96 (!) 145/108  Pulse: (!) 135  (!) 107 91  Resp: (!) 29  19 (!) 22  Temp: 97.9 F (36.6 C) 98.4 F (36.9 C) 97.8 F (36.6 C) 97.6 F (36.4 C)  TempSrc: Oral Oral    SpO2: 94%  93% 92%  Weight:    161 lb (73 kg)  Height:       Physical Exam  Cardiovascular: An irregularly irregular rhythm present. Tachycardia present.   No murmur heard. Abdominal: Soft. She exhibits no distension. There is no tenderness.  Extremities: no calf tenderness, 1+ bilateral pitting edema   Assessment/Plan:  GI bleed  Hgb stable at 13.1 today. Went for sigmoidoscopy this morning. Showed multiple diverticula in the sigmoid colon, internal hemorrhoids grade III. GI did not feel that the hemorrhoids would be amenable to banding and recommended outpatient colo-rectal surgery evaluation. Will resume regular diet today. Start anusol suppository bid. Resume Eliquis tomorrow. PT has evaluated the patient and recommends no PT follow up. -HH diet -Anusol suppository bid -Resume Eliquis tomorrow  Atrial fibrillation This was diagnosed at her previous hospitalization last week and she was discharged on Eliquis 2.5 mg daily. CHADVASC 4. She is also on diltiazem 240 mg daily at home for rate control. Started Diltiazem 30 mg q6 hrs yesterday. Rates have still been elevated to the 130-140 at time. BP has been stable.  -Increase Cardizem to 45 mg q6hr  AKI on CKD Stage IV  Cr slowly improving with IVF. 4.68>3.52>2.81.   HTN  BP labile but running in the 130-140s. Up to 160-170  at times. Takes Chlorthalidone 50 mg, labetalol 150 mg bid and Cardizem at home.  -Increasing Cardizem dose per above -Start labetalol 100 mg bid -Hold Chlorthalidone   Hypokalemia  Resolved  COPD  Continued home tiotropium 10 mcg inhaler and albuterol inhaler.  Arthritis  Home medications include prednisone 5 mg daily at home, tramadol 50 mg q 6 hrs PRN, and cymbalta 60 mg daily. We have continued her cymbalta 60 mg daily and tramadol.   obstructive sleep apnea She uses CPAP at home with a nasal mask. We had ordered CPAP on admission but we do not have the mask that she likes to use at home.   Insomnia  We have continued her home medication Xanax 0.5 mg at bedtime.   Hyperlipidemia  We have continued her home medication rosuvastatin 5 mg daily.   GERD  Her home medication isomeprazole 40 mg daily.    Allergies  We have continued her home flonase nasal spray.   Will lay out her medication regiment in detail for her in detail at discharge.   Dispo: Anticipated discharge in approximately 1 day(s).   Valentino NoseNathan Gahel Safley, MD 12/12/2015, 7:03 AM Pager: [phone]

## 2015-12-12 NOTE — Progress Notes (Addendum)
Silvana NewnessCarrie Sasso's phone #  : [phone]  Pt family called for update from [place] and was updated. Had spoken with pt if ok to give information and pt stated " Yes you can talk to her about me, she is the only one I trust, you can update her when she calls". Contact person is Charm BargesCarrie Hallquist from [place] and she is the patient's daughter-in-law. Had updated her that Case management and social worker  might call her about her questions regarding the patient,placement  Etc.Marland Kitchen..Marland Kitchen

## 2015-12-12 NOTE — Op Note (Addendum)
[facility] Patient Name: Victoria Mcgregorhyllis Hughes Procedure Date : 12/12/2015 MRN: [id number] Attending MD: Starr LakeHenry L. Myrtie Neitheranis , MD Date of Birth: 06-Oct-1935 CSN: [id number] Age: 80 Admit Type: Inpatient Procedure:                Flexible Sigmoidoscopy Indications:              Rectal hemorrhage Providers:                Sherilyn CooterHenry L. Myrtie Neitheranis, MD, Harold BarbanLiz Honeycutt, RN, Clearnce SorrelKatie Smith,                            Technician Referring MD:              Medicines:                Midazolam 3 mg IV Complications:            No immediate complications. Estimated Blood Loss:     Estimated blood loss: none. Procedure:                Pre-Anesthesia Assessment:                           - Prior to the procedure, a History and Physical                            was performed, and patient medications and                            allergies were reviewed. The patient's tolerance of                            previous anesthesia was also reviewed. The risks                            and benefits of the procedure and the sedation                            options and risks were discussed with the patient.                            All questions were answered, and informed consent                            was obtained. Prior Anticoagulants: The patient has                            taken Eliquis (apixaban), last dose was 3 days                            prior to procedure. ASA Grade Assessment: III - A                            patient with severe systemic disease. After  reviewing the risks and benefits, the patient was                            deemed in satisfactory condition to undergo the                            procedure.                           After obtaining informed consent, the scope was                            passed under direct vision. The EC-[id number] ([id number])                            scope was introduced through the anus and advanced           to the 40 cm from the anal verge. The flexible                            sigmoidoscopy was accomplished without difficulty.                            The patient tolerated the procedure well. The                            quality of the bowel preparation was good. Scope In: 8:19:30 AM Scope Out: 8:25:30 AM Total Procedure Duration: 0 hours 6 minutes 0 seconds  Findings:      The digital rectal exam findings include internal hemorrhoids that       prolapse with straining, but require manual replacement into the anal       canal (Grade III).      Multiple small-mouthed diverticula were found in the sigmoid colon. Impression:               - Internal hemorrhoids that prolapse with                            straining, but require manual replacement into the                            anal canal (Grade III) found on digital rectal exam.                           - Diverticulosis in the sigmoid colon.                           - No specimens collected.                           The hemorrhoids do not appear to be amenable to                            banding. Outpatient colo-rectal surgery evaluation  is warranted. Moderate Sedation:      Moderate (conscious) sedation was administered by the endoscopy nurse       and supervised by the endoscopist. The following parameters were       monitored: oxygen saturation, heart rate, blood pressure, respiratory       rate, EKG, adequacy of pulmonary ventilation, and response to care.       Total physician intraservice time was 8 minutes. Recommendation:           - Return to hospital ward                           Regular diet today                           Anusol suppository twice daily                           [facility] Procedure Code(s):        --- Professional ---                           [phone], Sigmoidoscopy, flexible; diagnostic,                            including collection of  specimen(s) by brushing or                            washing, when performed (separate procedure) Diagnosis Code(s):        --- Professional ---                           B28.4K64.2, Third degree hemorrhoids                           K62.5, Hemorrhage of anus and rectum                           K57.30, Diverticulosis of large intestine without                            perforation or abscess without bleeding CPT copyright 2016 American Medical Association. All rights reserved. The codes documented in this report are preliminary and upon coder review may  be revised to meet current compliance requirements. Henry L. Myrtie Neitheranis, MD 12/12/2015 8:35:32 AM This report has been signed electronically. Number of Addenda: 0

## 2015-12-12 NOTE — Interval H&P Note (Signed)
History and Physical Interval Note:  12/12/2015 8:12 AM  [name]  has presented today for surgery, with the diagnosis of bloody stool.  The various methods of treatment have been discussed with the patient and family. After consideration of risks, benefits and other options for treatment, the patient has consented to  Procedure(s): FLEXIBLE SIGMOIDOSCOPY (N/A) as a surgical intervention .  The patient's history has been reviewed, patient examined, no change in status, stable for surgery.  I have reviewed the patient's chart and labs.  Questions were answered to the patient's satisfaction.     Charlie PitterHenry L Danis III

## 2015-12-13 DIAGNOSIS — K922 Gastrointestinal hemorrhage, unspecified: Secondary | ICD-10-CM

## 2015-12-13 MED ORDER — DILTIAZEM HCL ER COATED BEADS 240 MG PO CP24
240.0000 mg | ORAL_CAPSULE | Freq: Every day | ORAL | Status: DC
  Administered 2015-12-13: 240 mg via ORAL
  Filled 2015-12-13: qty 1

## 2015-12-13 MED ORDER — HYDROCORTISONE ACETATE 25 MG RE SUPP: 25.0000 mg | Freq: Two times a day (BID) | RECTAL | 0 refills | Status: AC

## 2015-12-13 MED ORDER — LABETALOL HCL 100 MG PO TABS: 150.0000 mg | ORAL_TABLET | Freq: Two times a day (BID) | ORAL | Status: DC

## 2015-12-13 NOTE — Progress Notes (Signed)
Patient refused CPAP. Pt encouraged to call for Respiratory if CPAP needed during hospital stay.

## 2015-12-13 NOTE — Discharge Summary (Signed)
Name: [name] MRN: [id number] DOB: 29-Aug-1935 80 y.o. PCP: Blane Ohara, MD  Date of Admission: 12/10/2015  1:10 PM Date of Discharge: 12/13/2015 Attending Physician: Earl Lagos, MD  Discharge Diagnosis: Principal Problem:   GI bleed Active Problems:   HTN (hypertension)   Atrial fibrillation (HCC)   External hemorrhoids   Obstructive sleep apnea   Acute renal failure superimposed on stage 3 chronic kidney disease (HCC)   Rectal bleeding   Discharge Medications:   Medication List    STOP taking these medications   chlorthalidone 50 MG tablet Commonly known as:  HYGROTON   EDARBI 80 MG Tabs Generic drug:  Azilsartan Medoxomil     TAKE these medications   predniSONE 10 MG tablet daily  Commonly known as:  DELTASONE  acetaminophen 650 MG CR tablet Commonly known as:  TYLENOL Take 650 mg by mouth 2 (two) times daily.   ALPRAZolam 0.5 MG tablet Commonly known as:  XANAX Take 0.5 mg by mouth at bedtime.   apixaban 2.5 MG Tabs tablet Commonly known as:  ELIQUIS Take 2.5 mg by mouth 2 (two) times daily.   aspirin 81 MG tablet Take 81 mg by mouth daily.   BYSTOLIC 20 MG Tabs Generic drug:  Nebivolol HCl Take 20 mg by mouth daily.   cycloSPORINE 0.05 % ophthalmic emulsion Commonly known as:  RESTASIS Place 1 drop into both eyes 2 (two) times daily.   diltiazem 240 MG 24 hr capsule Commonly known as:  CARDIZEM CD Take 240 mg by mouth daily.   DULoxetine 60 MG capsule Commonly known as:  CYMBALTA Take 60 mg by mouth daily.   EPINEPHrine 0.3 mg/0.3 mL Soaj injection Commonly known as:  EPI-PEN Inject 0.3 mg into the muscle once as needed. Allergic reaction   fexofenadine 180 MG tablet Commonly known as:  ALLEGRA Take 180 mg by mouth daily.   fluticasone 50 MCG/ACT nasal spray Commonly known as:  FLONASE 2 sprays by Nasal route daily.   fluticasone furoate-vilanterol 200-25 MCG/INH Aepb Commonly known as:  BREO ELLIPTA Inhale 1 puff  into the lungs daily.   furosemide 20 MG tablet Commonly known as:  LASIX Take 1 tablet (20 mg total) by mouth daily as needed for edema (ankle edema). What changed:  how much to take  when to take this   hydrocortisone 25 MG suppository Commonly known as:  ANUSOL-HC Place 1 suppository (25 mg total) rectally 2 (two) times daily.   multivitamin tablet Take 1 tablet by mouth daily.   omeprazole 40 MG capsule Commonly known as:  PRILOSEC Take 40 mg by mouth daily.   rosuvastatin 5 MG tablet Commonly known as:  CRESTOR Take 10 mg by mouth daily at 6 PM.   tiotropium 18 MCG inhalation capsule Commonly known as:  SPIRIVA Place 18 mcg into inhaler and inhale daily.   traMADol 50 MG tablet Commonly known as:  ULTRAM Take 50 mg by mouth every 6 (six) hours as needed for moderate pain.   VENTOLIN HFA 108 (90 Base) MCG/ACT inhaler Generic drug:  albuterol Inhale 2 puffs into the lungs every 6 (six) hours as needed for wheezing.   Vitamin B-12 2500 MCG Subl Place under the tongue daily.       Disposition and follow-up:   VictoriaVictoria Hughes was discharged from [facility] in Good condition.  At the hospital follow up visit please address:  1. Lower GI bleed- Has she had any new episodes of bloody diarrhea?  Has she scheduled follow up with colo-rectal surgery?   Hypokalemia- She is on lasix and had some hypokalemia during this admission but has acute kidney injury so potassium supplementation was not started , please monitor.   2.  Labs / imaging needed at time of follow-up: CBC (10/30 Hgb 13.1), BMET (10/30 Crt 2.8, Hypokalemia)   3.  Pending labs/ test needing follow-up: none   Follow-up Appointments: Follow-up Information    [facility]. Go on 12/21/2015.   Specialty:  Cardiology Why:  you have an appointment scheduled at 2:30  Contact information: [address] [place] [address] [phone]        Blane OharaOX,KIRSTEN, MD. Go  on 12/17/2015.   Specialties:  Family Medicine, Interventional Cardiology, Radiology, Anesthesiology Why:  you have an appointment scheduled at 11 am  Contact information: [address] [place] [address] [phone]        [facility] .   Specialty:  Home Health Services Why:  Registered Nurse.  Contact information: [address] [place] [address] [phone]           Hospital Course by problem list:    GI bleed [name] is a 80 y.o. female with a PMH of CKD, ruptured abdominal aortic aneurism (10/2014), ventral hernia, new onset atrial fibrillation, external hemorrhoids, arthritis, iron deficiency anemia, obstructive sleep apnea, COPD, GERD who presents with diarrhea with bright red blood, shortness of breath, and weakness. On admission she had a hemoglobin 10.8 an was in atrial fibrillation with HR 90-100s, but had otherwise normal vital signs. She was transfused 1 unit PRBC and Hgb improved to 11.3.  GI was consulted and flexible sigmoidoscopy showed diverticulosis and internal hemorrhoids not amendable to banding. She was started on anusol suppositories and was advised to follow up with colo rectal surgery.     Atrial fibrillation (HCC) This was newly diagnosed during hospitalization one week prior to this admission. She had been on Nebivolol 20 mg daily prior to this hospitalization and was started on Diltiazem 240 mg daily and Eliquis 2.5 mg BID after discharge. Eliquis was held initially in the setting of lower GI bleed. Her rates were not completely controlled with Diltiazem 240 mg daily so this and Nebivolol were continued at discharge.     Acute renal failure superimposed on stage 3 chronic kidney disease (HCC) On admission she had Crt 3.7 this has improved to 2.8, with baseline crt around 2, this was though to be acute kidney injury related to her volume loss. At discharge her ARB was held.      HTN (hypertension) Home medications  include lasix 20 mg, chlorthalidone 50 mg, diltiazem 240 mg, and Edarbi 80 mg daily. During this admission she was given Diltiazem 240 mg for Afib rate control and remained normotensive. On the day of discharge her Crt was 2.81 she was told to hold taking edarbi and chlorthalidone until follow up with her PCP.     Obstructive sleep apnea  She is on CPAP at home, this was ordered for this hospitalization however given   Arthritis  Home medications include tramadol 50 mg q 6 hrs PRN, cymbalta 60 mg daily, and prednisone 10 mg daily. Cymbalta 60 mg daily was continued for this admission.   Discharge Vitals:   BP (!) 119/91 (BP Location: Left Arm)   Pulse (!) 131   Temp 98 F (36.7 C) (Oral)   Resp 20   Ht 5\' 2"  (1.575  m)   Wt 162 lb 1.6 oz (73.5 kg)   SpO2 94%   BMI 29.65 kg/m   Procedures Performed:  Sigmoidoscopy: - Internal hemorrhoids that prolapse with                            straining, but require manual replacement into the                            anal canal (Grade III) found on digital rectal exam.                           - Diverticulosis in the sigmoid colon.                           - No specimens collected.                           The hemorrhoids do not appear to be amenable to                            banding. Outpatient colo-rectal surgery evaluation                            is warranted.   Discharge Instructions: Discharge Instructions    Call MD for:  persistant nausea and vomiting    Complete by:  As directed    Call MD for:  severe uncontrolled pain    Complete by:  As directed    Call MD for:  temperature >100.4    Complete by:  As directed    Diet - low sodium heart healthy    Complete by:  As directed    Increase activity slowly    Complete by:  As directed       Signed: Eulah PontNina Evalene Vath, MD 12/13/2015, 2:59 PM   Pager: [phone]

## 2015-12-13 NOTE — Care Management Note (Signed)
Case Management Note  Patient Details  Name: [name] MRN: [id number] Date of Birth: [birth year]  Subjective/Objective: Pt presented for GI Bleed. Pt is from home and [facility]. Pt would like to continue to use the agency. RN to assist with Medication Management.                    Action/Plan: CM to [facility]. Pt stated she was having difficulty with medications. CM did provide pt with a number to [facility] to see if patient could be assisted. CM unable to assist with meds due to insurance. No further needs from CM at this time.   Expected Discharge Date:                  Expected Discharge Plan:  Home w Home Health Services  In-House Referral:  NA  Discharge planning Services  CM Consult, Medication Assistance  Post Acute Care Choice:  Home Health Choice offered to:  Patient  DME Arranged:  N/A DME Agency:  NA  HH Arranged:  RN HH Agency[facility]  [facility]  Status of Service:  Completed, signed off  If discussed at Long Length of Stay Meetings, dates discussed:    Additional Comments:  [name], Victoria Norman Kaye, RN 12/13/2015, 2:39 PM

## 2015-12-13 NOTE — Discharge Instructions (Signed)
Stool for Occult Blood Test WHY AM I HAVING THIS TEST? Stool for occult blood, or fecal occult blood test (FOBT), is a test that is used to screen for gastrointestinal (GI) bleeding, which may be an indicator of colon cancer. This test can also detect small amounts of blood in your stool (feces) from other causes, such as ulcers or hemorrhoids. This test is usually done as part of an annual routine examination after age 78. WHAT KIND OF SAMPLE IS TAKEN? A sample of your stool is required for this test. Your health care provider may collect the sample with a swab of the rectum. Or, you may be instructed to collect the sample in a container at home. If you are instructed to collect the sample, your health care provider will provide you with the instructions and the supplies that you will need to do that. WILL I NEED TO COLLECT SAMPLES AT HOME?  A stool sample may need to be collected at home. When collecting a sample at home, make sure that you:  Use the sterile containers and other supplies that were given to you from the lab.  Do not mix urine, toilet paper, or water with your sample.  Label all slides and containers with your name and the date when you collected the sample. Your health care provider or lab staff will give you one or more test "cards." You will collect a separate sample from three different stools, usually on different days that follow each other. Follow these steps for each sample: 1. Collect a stool sample into a clean container. 2. With an applicator stick, apply a thin smear of stool sample onto each filter paper square or window that is on the card. 3. Allow the filter paper to dry. After it is dry, the sample will be stable. Usually, you will return all of the samples to your health care provider or lab at the same time.  HOW DO I PREPARE FOR THE TEST?  Do not eat any red meat within three days before testing.  Follow your health care provider's instructions about  eating and drinking prior to the test. Your health care provider may instruct you to avoid other foods or substances.  Ask your health care provider about taking or not taking your medicines prior to the test. You may be instructed to avoid certain medicines that are known to interfere with this test. HOW ARE THE TEST RESULTS REPORTED? Your test results will be reported as either positive or negative. It is your responsibility to obtain your test results. Ask the lab or department performing the test when and how you will get your results. WHAT DO THE RESULTS MEAN? A negative test result means that there is no occult blood within the stool. A negative result is normal. A positive test result may mean that there is blood in the stool. Causes of blood in the stool include:  GI tumors.  Certain GI diseases.  GI trauma or recent surgery.  Hemorrhoids. Talk with your health care provider to discuss your results, treatment options, and if necessary, the need for more tests. Talk with your health care provider if you have any questions about your results.   This information is not intended to replace advice given to you by your health care provider. Make sure you discuss any questions you have with your health care provider.   Document Released: 02/25/2004 Document Revised: 02/20/2014 Document Reviewed: 06/27/2013 Elsevier Interactive Patient Education [employer] Inc. Thank you for trusting  us with your medical care!  You were hospitalized for a gastrointestinal bleed from your hemorrhoids and treated with anusol suppositories and blood transfusion .   Start taking anusol suppositories twice daily  Set up an appointment with a colo-rectal surgeon  Stop taking: Edarbi and chlorthalidone   To make sure you are getting better, please make it to the follow-up appointments listed on the first page.  If you have any questions, please call [phone].   Information on my medicine - ELIQUIS  (apixaban)  This medication education was reviewed with me or my healthcare representative as part of my discharge preparation.    Why was Eliquis prescribed for you? Eliquis was prescribed for you to reduce the risk of forming blood clots that can cause a stroke if you have a medical condition called atrial fibrillation (a type of irregular heartbeat) OR to reduce the risk of a blood clots forming after orthopedic surgery.  What do You need to know about Eliquis ? Take your Eliquis TWICE DAILY - one tablet in the morning and one tablet in the evening with or without food.  It would be best to take the doses about the same time each day.  If you have difficulty swallowing the tablet whole please discuss with your pharmacist how to take the medication safely.  Take Eliquis exactly as prescribed by your doctor and DO NOT stop taking Eliquis without talking to the doctor who prescribed the medication.  Stopping may increase your risk of developing a new clot or stroke.  Refill your prescription before you run out.  After discharge, you should have regular check-up appointments with your healthcare provider that is prescribing your Eliquis.  In the future your dose may need to be changed if your kidney function or weight changes by a significant amount or as you get older.  What do you do if you miss a dose? If you miss a dose, take it as soon as you remember on the same day and resume taking twice daily.  Do not take more than one dose of ELIQUIS at the same time.  Important Safety Information A possible side effect of Eliquis is bleeding. You should call your healthcare provider right away if you experience any of the following: ? Bleeding from an injury or your nose that does not stop. ? Unusual colored urine (red or dark brown) or unusual colored stools (red or black). ? Unusual bruising for unknown reasons. ? A serious fall or if you hit your head (even if there is no  bleeding).  Some medicines may interact with Eliquis and might increase your risk of bleeding or clotting while on Eliquis. To help avoid this, consult your healthcare provider or pharmacist prior to using any new prescription or non-prescription medications, including herbals, vitamins, non-steroidal anti-inflammatory drugs (NSAIDs) and supplements.  This website has more information on Eliquis (apixaban): [email or web address].

## 2015-12-13 NOTE — Progress Notes (Signed)
Discharged to home via W/C, condition stable, discharge education complete with verbalization of understanding from pt, left facility via private vehicle accompanied by neighbor.  Raymon MuttonGwen Amra Shukla RN

## 2015-12-13 NOTE — Progress Notes (Signed)
PT Cancellation Note  Patient Details Name: [name] MRN: [id number] DOB: [birth year]   Cancelled Treatment:    Reason Eval/Treat Not Completed: Patient declined, no reason specified Pt declined participating in therapy, reports she is going home and does not feel like doing anything.    Blake DivineShauna A [name] 12/13/2015, 3:36 PM Mylo RedShauna Kiwanna Spraker, PT, DPT [phone]

## 2015-12-13 NOTE — Progress Notes (Addendum)
Subjective: [name] is feeling well today and free of any new concerns or complaints. She has tolerated walking around her room without feeling lightheaded or short of breath. She says that her bloody bowel movements have improved significantly. She feels ready to go home today.   Objective:  Vital signs in last 24 hours: Vitals:   12/13/15 0409 12/13/15 0822 12/13/15 0903 12/13/15 1317  BP: (!) 145/84  (!) 139/100 (!) 119/91  Pulse: 88  (!) 103 (!) 131  Resp: 20     Temp: 97.8 F (36.6 C)  97.6 F (36.4 C) 98 F (36.7 C)  TempSrc: Oral  Oral Oral  SpO2: 95% 94% 94% 94%  Weight: 162 lb 1.6 oz (73.5 kg)     Height:       Physical Exam  Cardiovascular: An irregularly irregular rhythm present. Tachycardia present.   No murmur heard. Pulmonary/Chest: Effort normal. She has no wheezes. She has no rales.  Abdominal: Soft. She exhibits no distension. There is no tenderness.  Extremities: no calf tenderness, 1+ bilateral pitting edema   Medications: Infusions:   Scheduled Medications: . ALPRAZolam  0.5 mg Oral QHS  . apixaban  2.5 mg Oral BID  . cycloSPORINE  1 drop Both Eyes BID  . diltiazem  240 mg Oral Daily  . DULoxetine  60 mg Oral Daily  . fluticasone  2 spray Each Nare Daily  . fluticasone furoate-vilanterol  1 puff Inhalation Daily  . hydrocortisone  25 mg Rectal BID  . pantoprazole  40 mg Oral Daily  . phenylephrine  1 suppository Rectal BID  . rosuvastatin  5 mg Oral q1800  . sodium chloride flush  3 mL Intravenous Q12H  . tiotropium  18 mcg Inhalation Daily   PRN Medications: albuterol, traMADol  Assessment/Plan: [name] is a 80 y.o. female with PMH CKD, ruptured abdominal aortic aneurism (10/2014), ventral hernia, new onset atrial fibrillation, external hemorrhoids, arthritis, iron deficiency anemia, obstructive sleep apnea. Presented 10/27 with bright red blood per rectum and weakness and was found to have Hgb 10.   GI bleed She  received 1 unit of PRBC 10/27 and Hgb improved to 12.8. Sigmoidoscopy revealed hemorrhoids and diverticulosis. She has not had any episodes of bloody diarrhea overnight.  She will need outpatient colo-rectal surgery evaluation for grade III internal hemorrhoids not amendable to banding She will be discharged with anusol suppository twice daily  We have scheduled her for close follow up with PCP   atrial fibrillation,new onset  This was diagnosed at her previous hospitalization last week and she was discharged on Eliquis 2.5 mg daily. CHADVASC 4, will restart her eliquis today now that her lower GI bleed has resolved. Resumed her home medication diltiazem 240 mg daily for rate control, her rate remains elevated after this dose was given, will resume her nebivolol at discharge.   AKI on CKD Stage IV  On admission she had Crt 3.7 this has improved to 2.8, with baseline crt around 2, this may be AKI related to her volume loss.  Home medication list includes Lasix 20 mg and she was recently started on chlorthalidone 50 mg daily at home at her recent admission. At discharge we will hold her home medications Edarbi and chlorthalidone until she has followed up with her primary care provider.   Hypokalemia  Resolved   COPD  Home medications include tiotropium 10 mcg inhaler, prednisone 10 mg daily and albuterol inhaler. We will continue these medications at  discharge   Arthritis  Home medications include tramadol 50 mg q 6 hrs PRN and cymbalta 60 mg daily. We have continued her cymbalta 60 mg daily.   obstructive sleep apnea She uses CPAP at home with a nasal mask. We had ordered CPAP on admission but we do not have the mask that she likes to use at home.   Insomnia  We have continued her home medication Xanax 0.5 mg at bedtime.    Hyperlipidemia  We have continued her home medication rosuvastatin 5 mg daily.   GERD  Her home medication is omeprazole 40 mg daily. We have ordered IV  protonix BID.   Allergies  We have continued her home flonase nasal spray.   Will lay out her medication regiment in detail for her in detail at discharge.  Dispo: Anticipated discharge today    LOS: 3 days   Victoria PontNina Janee Ureste, MD 12/13/2015, 1:20 PM Pager: [phone]

## 2015-12-13 NOTE — Progress Notes (Signed)
  Date: 12/13/2015  Patient name: [name]  Medical record number: [id number]  Date of birth: 02/27/1935   I have seen and evaluated this patient. The plan of care was discussed with the house staff in detail. I agree with findings and plan as documented in Dr. Tamela OddiBlum's note.  Patient feels well today. No recurrent bleeding episodes.  CBC remained stable. Sigmoidoscopy revealed hemorrhoids and diverticulosis. Hemorrhoids were not amenable to banding. Will need outpatient f/u with colorectal surgery. Started on annusol suppository for her hemorrhoids. Will restart eliquis today.  Patient was diagnosed with afib on previous hospitalization. Noted to be tachycardic today with HR  110s to 130s. Resumed home cardizem dose and she was still noted to be tacycardic. Will resume home nebivolol as well. C/w eliquis given CHADVASC of 4. Will need close outpatient f/u with PCP.   Earl LagosNischal Franciso Dierks, MD 12/13/2015, 3:54 PM

## 2015-12-30 DIAGNOSIS — I4891 Unspecified atrial fibrillation: Secondary | ICD-10-CM

## 2015-12-30 DIAGNOSIS — J449 Chronic obstructive pulmonary disease, unspecified: Secondary | ICD-10-CM

## 2015-12-30 DIAGNOSIS — G92 Toxic encephalopathy: Secondary | ICD-10-CM

## 2015-12-30 DIAGNOSIS — Z515 Encounter for palliative care: Secondary | ICD-10-CM | POA: Diagnosis not present

## 2015-12-30 DIAGNOSIS — J9601 Acute respiratory failure with hypoxia: Secondary | ICD-10-CM | POA: Diagnosis not present

## 2015-12-30 DIAGNOSIS — K219 Gastro-esophageal reflux disease without esophagitis: Secondary | ICD-10-CM

## 2015-12-30 DIAGNOSIS — I2510 Atherosclerotic heart disease of native coronary artery without angina pectoris: Secondary | ICD-10-CM

## 2015-12-30 DIAGNOSIS — I5032 Chronic diastolic (congestive) heart failure: Secondary | ICD-10-CM

## 2015-12-30 DIAGNOSIS — G8929 Other chronic pain: Secondary | ICD-10-CM

## 2015-12-30 DIAGNOSIS — R4182 Altered mental status, unspecified: Secondary | ICD-10-CM

## 2015-12-30 DIAGNOSIS — J90 Pleural effusion, not elsewhere classified: Secondary | ICD-10-CM

## 2015-12-30 DIAGNOSIS — J181 Lobar pneumonia, unspecified organism: Secondary | ICD-10-CM | POA: Diagnosis not present

## 2015-12-30 DIAGNOSIS — M353 Polymyalgia rheumatica: Secondary | ICD-10-CM

## 2015-12-30 DIAGNOSIS — N179 Acute kidney failure, unspecified: Secondary | ICD-10-CM

## 2015-12-31 DIAGNOSIS — R4182 Altered mental status, unspecified: Secondary | ICD-10-CM | POA: Diagnosis not present

## 2015-12-31 DIAGNOSIS — J181 Lobar pneumonia, unspecified organism: Secondary | ICD-10-CM | POA: Diagnosis not present

## 2015-12-31 DIAGNOSIS — J90 Pleural effusion, not elsewhere classified: Secondary | ICD-10-CM | POA: Diagnosis not present

## 2015-12-31 DIAGNOSIS — J9601 Acute respiratory failure with hypoxia: Secondary | ICD-10-CM | POA: Diagnosis not present

## 2016-01-01 DIAGNOSIS — J181 Lobar pneumonia, unspecified organism: Secondary | ICD-10-CM | POA: Diagnosis not present

## 2016-01-01 DIAGNOSIS — J90 Pleural effusion, not elsewhere classified: Secondary | ICD-10-CM | POA: Diagnosis not present

## 2016-01-01 DIAGNOSIS — R4182 Altered mental status, unspecified: Secondary | ICD-10-CM | POA: Diagnosis not present

## 2016-01-01 DIAGNOSIS — J9601 Acute respiratory failure with hypoxia: Secondary | ICD-10-CM | POA: Diagnosis not present

## 2016-01-02 DIAGNOSIS — J90 Pleural effusion, not elsewhere classified: Secondary | ICD-10-CM | POA: Diagnosis not present

## 2016-01-02 DIAGNOSIS — J181 Lobar pneumonia, unspecified organism: Secondary | ICD-10-CM | POA: Diagnosis not present

## 2016-01-02 DIAGNOSIS — R4182 Altered mental status, unspecified: Secondary | ICD-10-CM | POA: Diagnosis not present

## 2016-01-02 DIAGNOSIS — J9601 Acute respiratory failure with hypoxia: Secondary | ICD-10-CM | POA: Diagnosis not present

## 2016-01-03 DIAGNOSIS — J181 Lobar pneumonia, unspecified organism: Secondary | ICD-10-CM | POA: Diagnosis not present

## 2016-01-03 DIAGNOSIS — R4182 Altered mental status, unspecified: Secondary | ICD-10-CM | POA: Diagnosis not present

## 2016-01-03 DIAGNOSIS — J90 Pleural effusion, not elsewhere classified: Secondary | ICD-10-CM | POA: Diagnosis not present

## 2016-01-03 DIAGNOSIS — J9601 Acute respiratory failure with hypoxia: Secondary | ICD-10-CM | POA: Diagnosis not present

## 2016-01-04 DIAGNOSIS — J9601 Acute respiratory failure with hypoxia: Secondary | ICD-10-CM | POA: Diagnosis not present

## 2016-01-04 DIAGNOSIS — J90 Pleural effusion, not elsewhere classified: Secondary | ICD-10-CM | POA: Diagnosis not present

## 2016-01-04 DIAGNOSIS — J181 Lobar pneumonia, unspecified organism: Secondary | ICD-10-CM | POA: Diagnosis not present

## 2016-01-04 DIAGNOSIS — R4182 Altered mental status, unspecified: Secondary | ICD-10-CM | POA: Diagnosis not present

## 2016-01-04 DIAGNOSIS — F419 Anxiety disorder, unspecified: Secondary | ICD-10-CM

## 2016-01-05 DIAGNOSIS — G92 Toxic encephalopathy: Secondary | ICD-10-CM | POA: Diagnosis not present

## 2016-01-05 DIAGNOSIS — I2510 Atherosclerotic heart disease of native coronary artery without angina pectoris: Secondary | ICD-10-CM | POA: Diagnosis not present

## 2016-01-05 DIAGNOSIS — J9601 Acute respiratory failure with hypoxia: Secondary | ICD-10-CM | POA: Diagnosis not present

## 2016-01-05 DIAGNOSIS — G8929 Other chronic pain: Secondary | ICD-10-CM | POA: Diagnosis not present

## 2016-01-05 DIAGNOSIS — N179 Acute kidney failure, unspecified: Secondary | ICD-10-CM | POA: Diagnosis not present

## 2016-01-05 DIAGNOSIS — M353 Polymyalgia rheumatica: Secondary | ICD-10-CM | POA: Diagnosis not present

## 2016-01-05 DIAGNOSIS — I5032 Chronic diastolic (congestive) heart failure: Secondary | ICD-10-CM | POA: Diagnosis not present

## 2016-01-05 DIAGNOSIS — J90 Pleural effusion, not elsewhere classified: Secondary | ICD-10-CM | POA: Diagnosis not present

## 2016-01-05 DIAGNOSIS — K219 Gastro-esophageal reflux disease without esophagitis: Secondary | ICD-10-CM | POA: Diagnosis not present

## 2016-01-05 DIAGNOSIS — J181 Lobar pneumonia, unspecified organism: Secondary | ICD-10-CM | POA: Diagnosis not present

## 2016-01-05 DIAGNOSIS — Z515 Encounter for palliative care: Secondary | ICD-10-CM

## 2016-01-05 DIAGNOSIS — R4182 Altered mental status, unspecified: Secondary | ICD-10-CM | POA: Diagnosis not present

## 2016-02-14 DEATH — deceased
